# Patient Record
Sex: Male | Born: 1970 | Race: White | Hispanic: No | Marital: Single | Smoking: Current every day smoker
Health system: Southern US, Community
[De-identification: ages and names within clinical notes are randomized; demographics above are authoritative.]

## PROBLEM LIST (undated history)

## (undated) DIAGNOSIS — F329 Major depressive disorder, single episode, unspecified: Secondary | ICD-10-CM

## (undated) DIAGNOSIS — F419 Anxiety disorder, unspecified: Secondary | ICD-10-CM

## (undated) DIAGNOSIS — F32A Depression, unspecified: Secondary | ICD-10-CM

## (undated) DIAGNOSIS — F3130 Bipolar disorder, current episode depressed, mild or moderate severity, unspecified: Secondary | ICD-10-CM

## (undated) DIAGNOSIS — K7460 Unspecified cirrhosis of liver: Secondary | ICD-10-CM

## (undated) HISTORY — PX: HERNIA REPAIR: SHX51

---

## 2006-06-20 ENCOUNTER — Emergency Department: Payer: Self-pay | Admitting: Emergency Medicine

## 2006-06-21 ENCOUNTER — Other Ambulatory Visit: Payer: Self-pay

## 2006-06-22 ENCOUNTER — Inpatient Hospital Stay: Payer: Self-pay | Admitting: Internal Medicine

## 2006-07-07 ENCOUNTER — Emergency Department: Payer: Self-pay | Admitting: Emergency Medicine

## 2007-06-14 IMAGING — CR DG CHEST 1V
1 series · 1 of 1 positions shown · non-contrast
Comparison: none

REASON FOR EXAM: WANT A LATERAL  DECUBITUS FILM--- LEFT SIDE
COMMENTS:

PROCEDURE:     DXR - DXR CHEST 1 VIEWAP OR PA  - June 26, 2006  [DATE]
RESULT:          A single lateral decubitus film was obtained.  There
appears a small amount of pleural effusion on the LEFT.

[view not recorded]
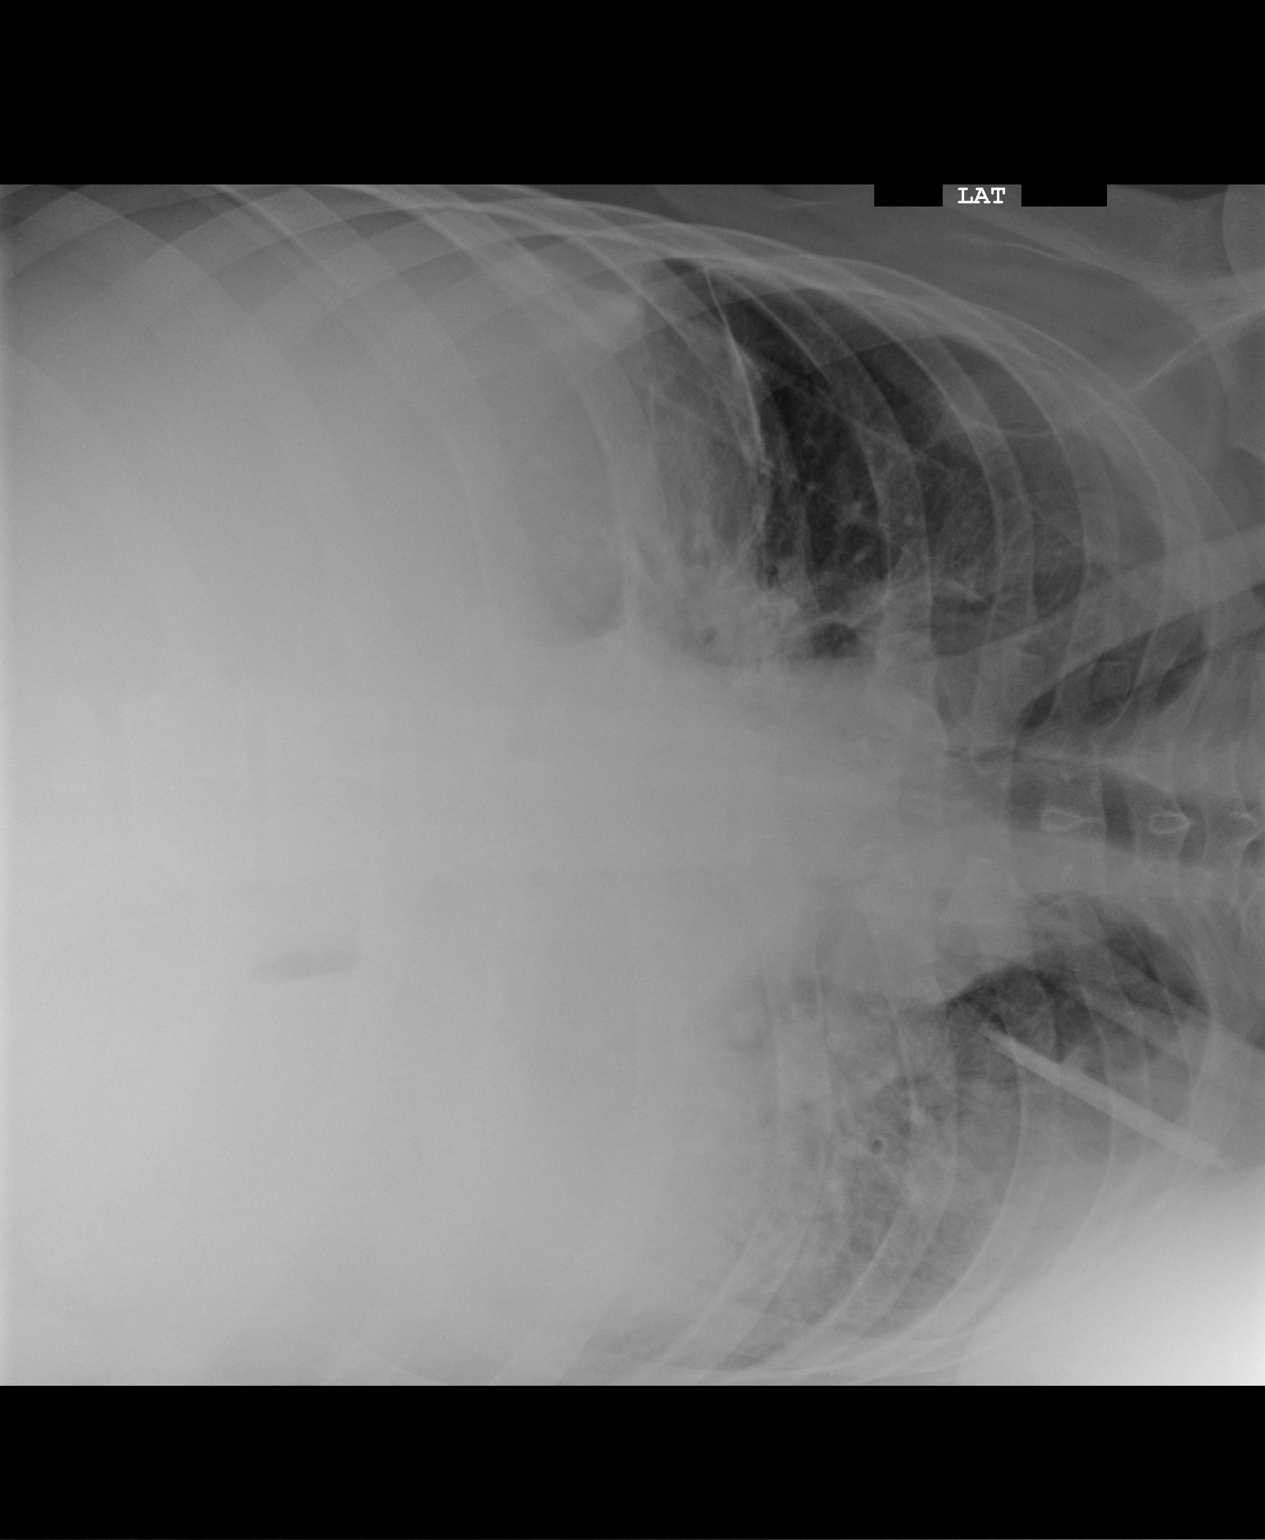

[1 of 1 positions shown; findings below may reference images not displayed]

IMPRESSION: Please see above.

## 2007-06-14 IMAGING — CR DG CHEST 1V PORT
1 series · 2 of 2 positions shown · non-contrast
Comparison: none

REASON FOR EXAM: Left pleural effusion as seen on chest CT
COMMENTS:

[Series 1: view not recorded · 0.17mm/px · 2 of 2 slices shown]
[im 1/2]
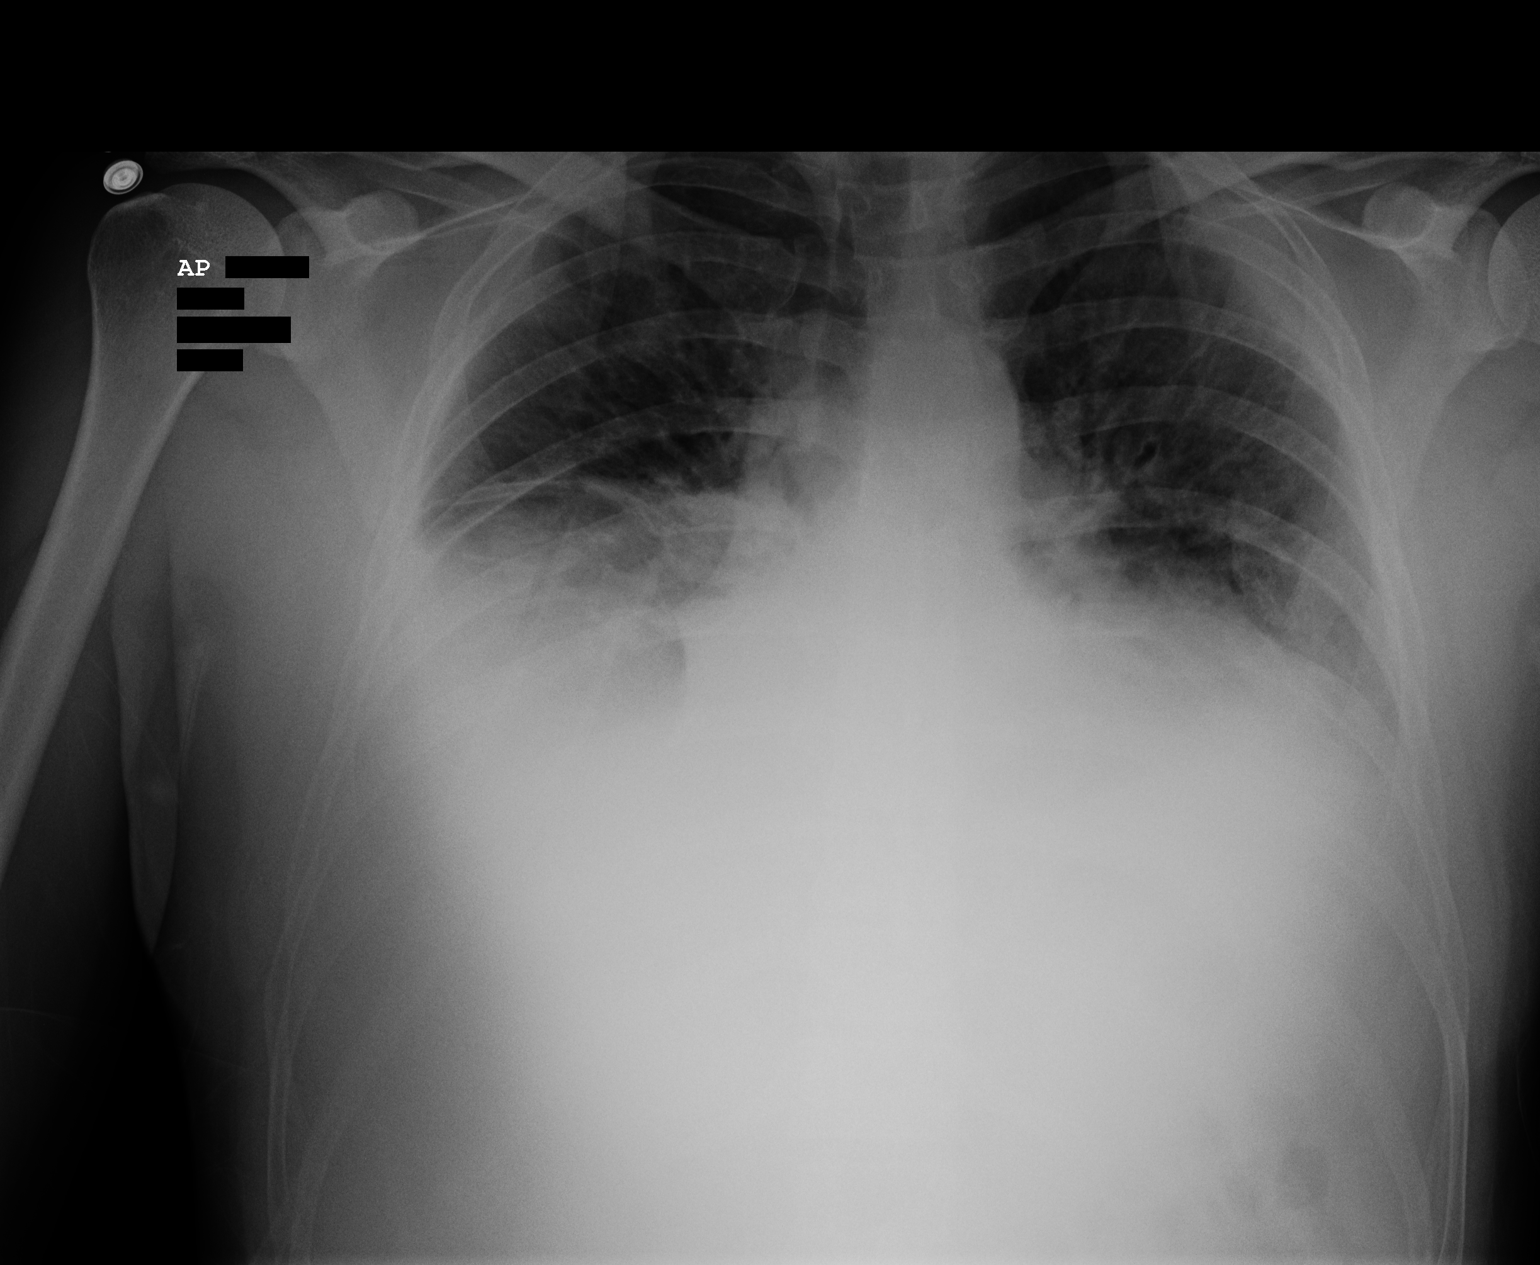
[im 2/2]
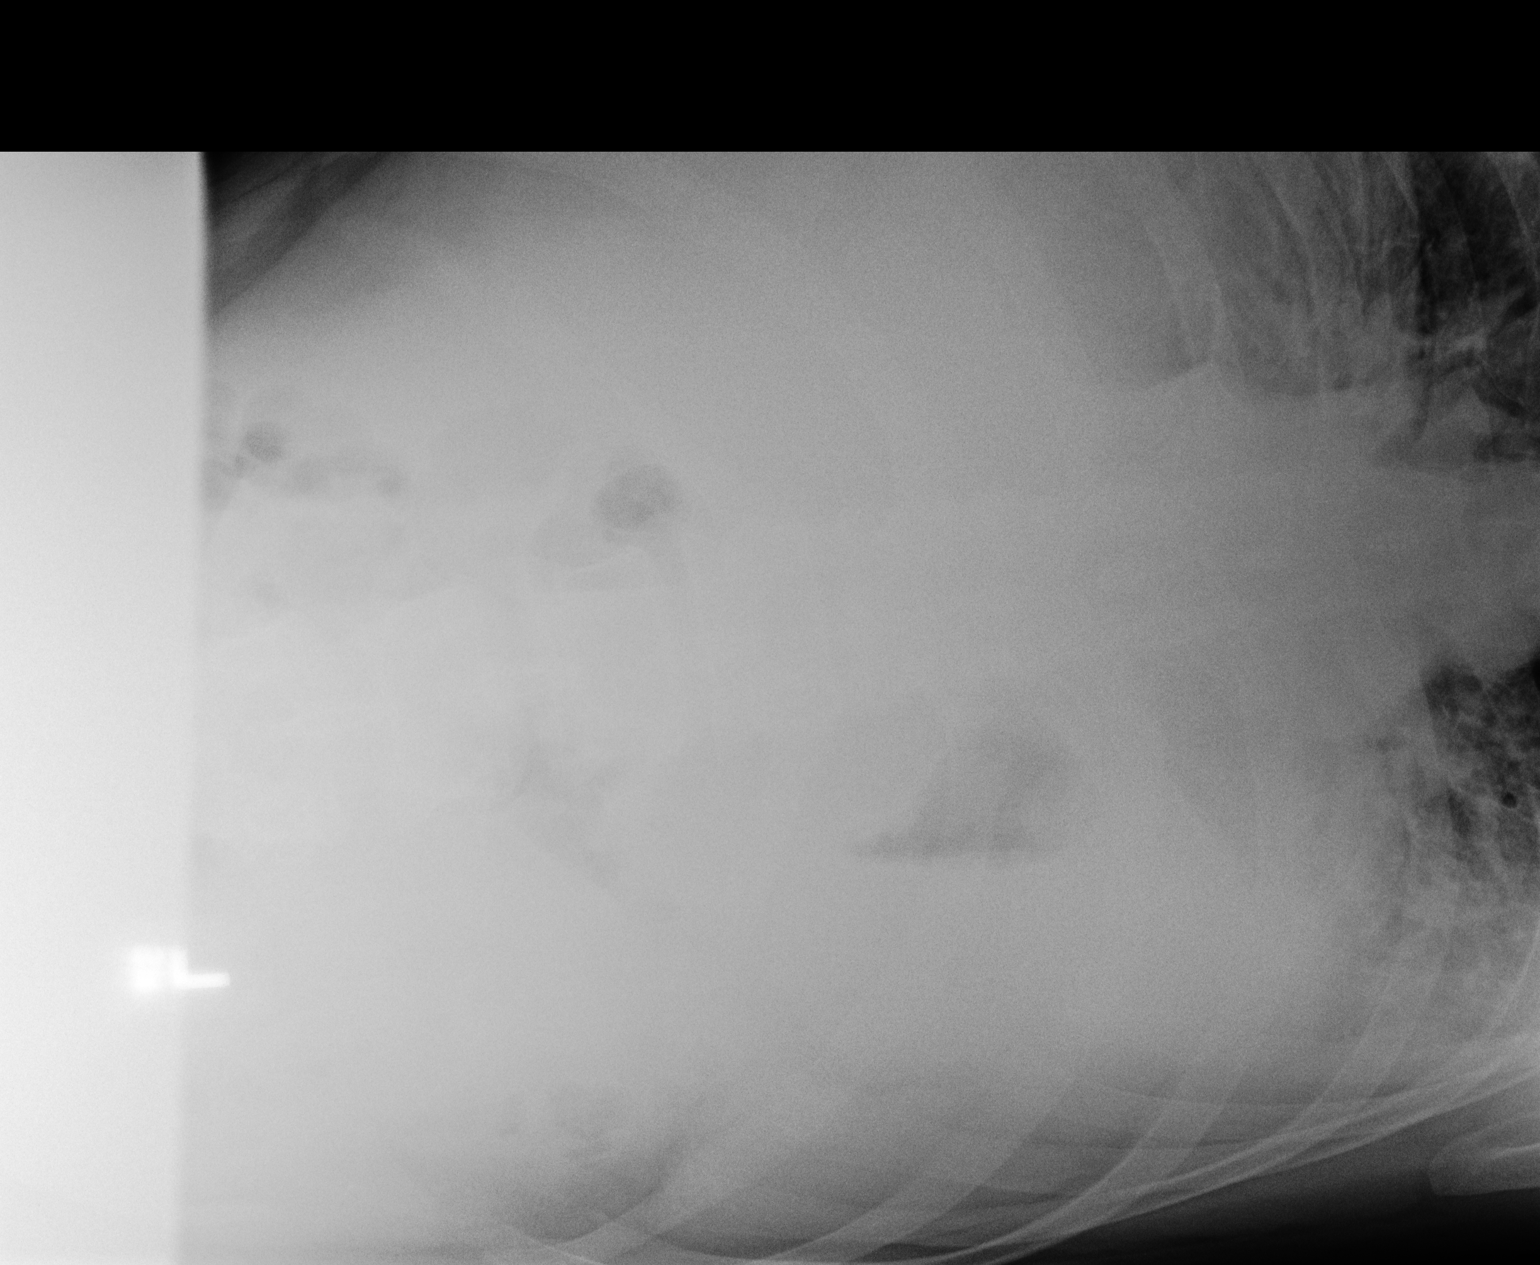

[2 of 2 positions shown; findings below may reference images not displayed]

PROCEDURE:     DXR - DXR PORTABLE CHEST SINGLE VIEW  - June 26, 2006  [DATE]

RESULT:     AP view of the chest is compared to a prior exam of 06/25/2006.
The current exam shows bibasilar infiltrates compatible with pneumonia or
atelectasis. The upper lobe infiltrates noted at prior CT are not definitely
seen on plain film examination. Following the portable chest radiograph, a
portable lateral decubitus view was obtained but the views of the abdomen
and only a small portion of the LEFT lung base is seen. There is increased
density at the LEFT lung base but the LEFT lung is not sufficiently
visualized to evaluate for layering of fluid. Repeat lateral decubitus views
specifically for the chest is; therefore, needed to evaluate for layering of
LEFT pleural effusion.
IMPRESSION: Please see above.

## 2009-05-20 ENCOUNTER — Emergency Department: Payer: Self-pay | Admitting: Emergency Medicine

## 2010-03-18 ENCOUNTER — Emergency Department: Payer: Self-pay | Admitting: Emergency Medicine

## 2010-12-27 ENCOUNTER — Emergency Department: Payer: Self-pay | Admitting: Emergency Medicine

## 2011-08-14 ENCOUNTER — Emergency Department: Payer: Self-pay | Admitting: Emergency Medicine

## 2012-10-10 ENCOUNTER — Emergency Department: Payer: Self-pay | Admitting: Emergency Medicine

## 2014-04-04 ENCOUNTER — Emergency Department: Payer: Self-pay | Admitting: Emergency Medicine

## 2014-04-04 LAB — ETHANOL: Ethanol: <3 mg/dL

## 2014-04-04 LAB — COMPREHENSIVE METABOLIC PANEL
ALK PHOS: 86 U/L
ALT: 126 U/L — AB (ref 12–78)
AST: 218 U/L — AB (ref 15–37)
Albumin: 4.5 g/dL (ref 3.4–5.0)
Anion Gap: 9 (ref 7–16)
BUN: 10 mg/dL (ref 7–18)
Bilirubin,Total: 4.9 mg/dL — ABNORMAL HIGH (ref 0.2–1.0)
CHLORIDE: 93 mmol/L — AB (ref 98–107)
CO2: 30 mmol/L (ref 21–32)
CREATININE: 0.77 mg/dL (ref 0.60–1.30)
Calcium, Total: 9.4 mg/dL (ref 8.5–10.1)
EGFR (Non-African Amer.): >60
GLUCOSE: 126 mg/dL — AB (ref 65–99)
OSMOLALITY: 265 (ref 275–301)
POTASSIUM: 2.8 mmol/L — AB (ref 3.5–5.1)
SODIUM: 132 mmol/L — AB (ref 136–145)
TOTAL PROTEIN: 9.1 g/dL — AB (ref 6.4–8.2)

## 2014-04-04 LAB — CBC WITH DIFFERENTIAL/PLATELET
BASOS ABS: 0 10*3/uL (ref 0.0–0.1)
BASOS PCT: 0.4 %
EOS ABS: 0 10*3/uL (ref 0.0–0.7)
EOS PCT: 0.1 %
HCT: 43.8 % (ref 40.0–52.0)
HGB: 14.9 g/dL (ref 13.0–18.0)
Lymphocyte #: 1.6 10*3/uL (ref 1.0–3.6)
Lymphocyte %: 17.6 %
MCH: 34 pg (ref 26.0–34.0)
MCHC: 33.9 g/dL (ref 32.0–36.0)
MCV: 100 fL (ref 80–100)
Monocyte #: 1.2 x10 3/mm — ABNORMAL HIGH (ref 0.2–1.0)
Monocyte %: 12.6 %
NEUTROS ABS: 6.5 10*3/uL (ref 1.4–6.5)
Neutrophil %: 69.3 %
Platelet: 104 10*3/uL — ABNORMAL LOW (ref 150–440)
RBC: 4.37 10*6/uL — AB (ref 4.40–5.90)
RDW: 13.8 % (ref 11.5–14.5)
WBC: 9.3 10*3/uL (ref 3.8–10.6)

## 2014-04-04 LAB — URINALYSIS, COMPLETE
Bacteria: NONE SEEN
GLUCOSE, UR: NEGATIVE mg/dL (ref 0–75)
Leukocyte Esterase: NEGATIVE
Nitrite: NEGATIVE
PH: 7 (ref 4.5–8.0)
SPECIFIC GRAVITY: 1.029 (ref 1.003–1.030)
Squamous Epithelial: 1
WBC UR: 1 /HPF (ref 0–5)

## 2014-04-04 LAB — TROPONIN I

## 2014-04-13 ENCOUNTER — Observation Stay: Payer: Self-pay | Admitting: Internal Medicine

## 2014-04-13 LAB — COMPREHENSIVE METABOLIC PANEL
ALBUMIN: 3.6 g/dL (ref 3.4–5.0)
ANION GAP: 10 (ref 7–16)
Alkaline Phosphatase: 76 U/L
BUN: 4 mg/dL — AB (ref 7–18)
Bilirubin,Total: 1.8 mg/dL — ABNORMAL HIGH (ref 0.2–1.0)
CALCIUM: 8.3 mg/dL — AB (ref 8.5–10.1)
CHLORIDE: 106 mmol/L (ref 98–107)
Co2: 25 mmol/L (ref 21–32)
Creatinine: 0.49 mg/dL — ABNORMAL LOW (ref 0.60–1.30)
EGFR (African American): >60
EGFR (Non-African Amer.): >60
Glucose: 93 mg/dL (ref 65–99)
Osmolality: 278 (ref 275–301)
Potassium: 3.4 mmol/L — ABNORMAL LOW (ref 3.5–5.1)
SGOT(AST): 227 U/L — ABNORMAL HIGH (ref 15–37)
SGPT (ALT): 127 U/L — ABNORMAL HIGH (ref 12–78)
SODIUM: 141 mmol/L (ref 136–145)
Total Protein: 7.7 g/dL (ref 6.4–8.2)

## 2014-04-13 LAB — PROTIME-INR
INR: 1.3
Prothrombin Time: 15.6 secs — ABNORMAL HIGH (ref 11.5–14.7)

## 2014-04-13 LAB — URINALYSIS, COMPLETE
BLOOD: NEGATIVE
Bacteria: NONE SEEN
Bilirubin,UR: NEGATIVE
Glucose,UR: NEGATIVE mg/dL (ref 0–75)
KETONE: NEGATIVE
LEUKOCYTE ESTERASE: NEGATIVE
NITRITE: NEGATIVE
PH: 7 (ref 4.5–8.0)
PROTEIN: NEGATIVE
Specific Gravity: 1.003 (ref 1.003–1.030)
Squamous Epithelial: NONE SEEN
WBC UR: NONE SEEN /HPF (ref 0–5)

## 2014-04-13 LAB — TROPONIN I
Troponin-I: <0.02 ng/mL
Troponin-I: <0.02 ng/mL

## 2014-04-13 LAB — DRUG SCREEN, URINE
AMPHETAMINES, UR SCREEN: NEGATIVE (ref ?–1000)
BARBITURATES, UR SCREEN: NEGATIVE (ref ?–200)
BENZODIAZEPINE, UR SCRN: NEGATIVE (ref ?–200)
COCAINE METABOLITE, UR ~~LOC~~: NEGATIVE (ref ?–300)
Cannabinoid 50 Ng, Ur ~~LOC~~: NEGATIVE (ref ?–50)
MDMA (ECSTASY) UR SCREEN: NEGATIVE (ref ?–500)
Methadone, Ur Screen: NEGATIVE (ref ?–300)
OPIATE, UR SCREEN: NEGATIVE (ref ?–300)
Phencyclidine (PCP) Ur S: NEGATIVE (ref ?–25)
Tricyclic, Ur Screen: NEGATIVE (ref ?–1000)

## 2014-04-13 LAB — CK-MB
CK-MB: 2.7 ng/mL (ref 0.5–3.6)
CK-MB: 2.5 ng/mL (ref 0.5–3.6)
CK-MB: 2.2 ng/mL (ref 0.5–3.6)

## 2014-04-13 LAB — APTT: Activated PTT: 36 secs — ABNORMAL HIGH (ref 23.6–35.9)

## 2014-04-13 LAB — AMMONIA: Ammonia, Plasma: 59 mcmol/L — ABNORMAL HIGH (ref 11–32)

## 2014-04-13 LAB — CK
CK, TOTAL: 275 U/L
CK, TOTAL: 242 U/L

## 2014-04-13 LAB — ETHANOL
ETHANOL %: 0.301 % — AB (ref 0.000–0.080)
ETHANOL LVL: 301 mg/dL — AB

## 2014-04-14 LAB — BASIC METABOLIC PANEL
Anion Gap: 8 (ref 7–16)
BUN: 5 mg/dL — ABNORMAL LOW (ref 7–18)
Calcium, Total: 8.8 mg/dL (ref 8.5–10.1)
Chloride: 104 mmol/L (ref 98–107)
Co2: 25 mmol/L (ref 21–32)
Creatinine: 0.71 mg/dL (ref 0.60–1.30)
EGFR (African American): >60
GLUCOSE: 104 mg/dL — AB (ref 65–99)
OSMOLALITY: 271 (ref 275–301)
Potassium: 3.8 mmol/L (ref 3.5–5.1)
Sodium: 137 mmol/L (ref 136–145)

## 2014-04-14 LAB — CBC WITH DIFFERENTIAL/PLATELET
BASOS ABS: 0.1 10*3/uL (ref 0.0–0.1)
BASOS PCT: 1.3 %
Eosinophil #: 0.1 10*3/uL (ref 0.0–0.7)
Eosinophil %: 2 %
HCT: 40.8 % (ref 40.0–52.0)
HGB: 13.8 g/dL (ref 13.0–18.0)
LYMPHS PCT: 26.6 %
Lymphocyte #: 1.3 10*3/uL (ref 1.0–3.6)
MCH: 34.4 pg — AB (ref 26.0–34.0)
MCHC: 33.7 g/dL (ref 32.0–36.0)
MCV: 102 fL — ABNORMAL HIGH (ref 80–100)
MONOS PCT: 17.9 %
Monocyte #: 0.9 x10 3/mm (ref 0.2–1.0)
NEUTROS PCT: 52.2 %
Neutrophil #: 2.6 10*3/uL (ref 1.4–6.5)
PLATELETS: 121 10*3/uL — AB (ref 150–440)
RBC: 4 10*6/uL — AB (ref 4.40–5.90)
RDW: 13.5 % (ref 11.5–14.5)
WBC: 4.9 10*3/uL (ref 3.8–10.6)

## 2014-05-23 ENCOUNTER — Emergency Department: Payer: Self-pay | Admitting: Emergency Medicine

## 2014-05-23 LAB — URINALYSIS, COMPLETE
Bacteria: NONE SEEN
Bilirubin,UR: NEGATIVE
Glucose,UR: NEGATIVE mg/dL (ref 0–75)
Ketone: NEGATIVE
LEUKOCYTE ESTERASE: NEGATIVE
Nitrite: NEGATIVE
Ph: 6 (ref 4.5–8.0)
Protein: NEGATIVE
RBC,UR: <1 /HPF (ref 0–5)
SQUAMOUS EPITHELIAL: NONE SEEN
Specific Gravity: 1.009 (ref 1.003–1.030)
WBC UR: <1 /HPF (ref 0–5)

## 2014-05-23 LAB — COMPREHENSIVE METABOLIC PANEL
ALBUMIN: 3.9 g/dL (ref 3.4–5.0)
ALK PHOS: 98 U/L
ALT: 122 U/L — AB
Anion Gap: 11 (ref 7–16)
BILIRUBIN TOTAL: 1.8 mg/dL — AB (ref 0.2–1.0)
BUN: 4 mg/dL — ABNORMAL LOW (ref 7–18)
CALCIUM: 8.5 mg/dL (ref 8.5–10.1)
CHLORIDE: 101 mmol/L (ref 98–107)
CREATININE: 0.61 mg/dL (ref 0.60–1.30)
Co2: 25 mmol/L (ref 21–32)
EGFR (African American): >60
GLUCOSE: 155 mg/dL — AB (ref 65–99)
OSMOLALITY: 274 (ref 275–301)
Potassium: 3.5 mmol/L (ref 3.5–5.1)
SGOT(AST): 283 U/L — ABNORMAL HIGH (ref 15–37)
Sodium: 137 mmol/L (ref 136–145)
TOTAL PROTEIN: 8.3 g/dL — AB (ref 6.4–8.2)

## 2014-05-23 LAB — DRUG SCREEN, URINE
Amphetamines, Ur Screen: NEGATIVE (ref ?–1000)
Barbiturates, Ur Screen: NEGATIVE (ref ?–200)
Benzodiazepine, Ur Scrn: NEGATIVE (ref ?–200)
CANNABINOID 50 NG, UR ~~LOC~~: POSITIVE (ref ?–50)
Cocaine Metabolite,Ur ~~LOC~~: NEGATIVE (ref ?–300)
MDMA (ECSTASY) UR SCREEN: NEGATIVE (ref ?–500)
Methadone, Ur Screen: POSITIVE (ref ?–300)
Opiate, Ur Screen: NEGATIVE (ref ?–300)
Phencyclidine (PCP) Ur S: NEGATIVE (ref ?–25)
Tricyclic, Ur Screen: NEGATIVE (ref ?–1000)

## 2014-05-23 LAB — CBC
HCT: 40.2 % (ref 40.0–52.0)
HGB: 13.7 g/dL (ref 13.0–18.0)
MCH: 34.9 pg — AB (ref 26.0–34.0)
MCHC: 34.1 g/dL (ref 32.0–36.0)
MCV: 102 fL — AB (ref 80–100)
Platelet: 118 10*3/uL — ABNORMAL LOW (ref 150–440)
RBC: 3.92 10*6/uL — AB (ref 4.40–5.90)
RDW: 14.3 % (ref 11.5–14.5)
WBC: 7.4 10*3/uL (ref 3.8–10.6)

## 2014-05-23 LAB — ACETAMINOPHEN LEVEL

## 2014-05-23 LAB — ETHANOL
Ethanol %: 0.364 % (ref 0.000–0.080)
Ethanol: 364 mg/dL

## 2014-05-23 LAB — SALICYLATE LEVEL: Salicylates, Serum: <1.7 mg/dL

## 2014-05-24 ENCOUNTER — Inpatient Hospital Stay: Payer: Self-pay | Admitting: Psychiatry

## 2014-05-24 LAB — ETHANOL
ETHANOL LVL: 278 mg/dL
Ethanol %: 0.102 % — ABNORMAL HIGH (ref 0.000–0.080)
Ethanol %: 0.166 % — ABNORMAL HIGH (ref 0.000–0.080)
Ethanol %: 0.278 % — ABNORMAL HIGH (ref 0.000–0.080)
Ethanol: 166 mg/dL
Ethanol: 102 mg/dL

## 2014-05-27 LAB — AMMONIA: AMMONIA, PLASMA: 51 umol/L — AB (ref 11–32)

## 2014-05-28 LAB — HEPATIC FUNCTION PANEL A (ARMC)
ALBUMIN: 3.3 g/dL — AB (ref 3.4–5.0)
Alkaline Phosphatase: 87 U/L
BILIRUBIN TOTAL: 2.4 mg/dL — AB (ref 0.2–1.0)
Bilirubin, Direct: 1 mg/dL — ABNORMAL HIGH (ref 0.00–0.20)
SGOT(AST): 99 U/L — ABNORMAL HIGH (ref 15–37)
SGPT (ALT): 72 U/L — ABNORMAL HIGH
Total Protein: 7.3 g/dL (ref 6.4–8.2)

## 2014-06-19 ENCOUNTER — Emergency Department: Payer: Self-pay | Admitting: Emergency Medicine

## 2014-06-25 ENCOUNTER — Inpatient Hospital Stay: Payer: Self-pay | Admitting: Psychiatry

## 2014-06-25 LAB — CBC
HCT: 41.5 % (ref 40.0–52.0)
HGB: 13.9 g/dL (ref 13.0–18.0)
MCH: 33.9 pg (ref 26.0–34.0)
MCHC: 33.5 g/dL (ref 32.0–36.0)
MCV: 101 fL — AB (ref 80–100)
PLATELETS: 157 10*3/uL (ref 150–440)
RBC: 4.1 10*6/uL — ABNORMAL LOW (ref 4.40–5.90)
RDW: 13.9 % (ref 11.5–14.5)
WBC: 9.8 10*3/uL (ref 3.8–10.6)

## 2014-06-25 LAB — URINALYSIS, COMPLETE
BLOOD: NEGATIVE
Bacteria: NONE SEEN
Bilirubin,UR: NEGATIVE
Glucose,UR: NEGATIVE mg/dL (ref 0–75)
Ketone: NEGATIVE
Leukocyte Esterase: NEGATIVE
Nitrite: NEGATIVE
PROTEIN: NEGATIVE
Ph: 6 (ref 4.5–8.0)
RBC,UR: 1 /HPF (ref 0–5)
Specific Gravity: 1.006 (ref 1.003–1.030)
Squamous Epithelial: NONE SEEN

## 2014-06-25 LAB — DRUG SCREEN, URINE
AMPHETAMINES, UR SCREEN: NEGATIVE (ref ?–1000)
BENZODIAZEPINE, UR SCRN: POSITIVE (ref ?–200)
Barbiturates, Ur Screen: NEGATIVE (ref ?–200)
Cannabinoid 50 Ng, Ur ~~LOC~~: POSITIVE (ref ?–50)
Cocaine Metabolite,Ur ~~LOC~~: NEGATIVE (ref ?–300)
MDMA (Ecstasy)Ur Screen: NEGATIVE (ref ?–500)
METHADONE, UR SCREEN: NEGATIVE (ref ?–300)
Opiate, Ur Screen: NEGATIVE (ref ?–300)
Phencyclidine (PCP) Ur S: NEGATIVE (ref ?–25)
Tricyclic, Ur Screen: NEGATIVE (ref ?–1000)

## 2014-06-25 LAB — COMPREHENSIVE METABOLIC PANEL
ALBUMIN: 3.6 g/dL (ref 3.4–5.0)
ALK PHOS: 87 U/L
AST: 219 U/L — AB (ref 15–37)
Anion Gap: 8 (ref 7–16)
BILIRUBIN TOTAL: 0.9 mg/dL (ref 0.2–1.0)
BUN: 5 mg/dL — ABNORMAL LOW (ref 7–18)
CHLORIDE: 107 mmol/L (ref 98–107)
CREATININE: 0.84 mg/dL (ref 0.60–1.30)
Calcium, Total: 8.8 mg/dL (ref 8.5–10.1)
Co2: 25 mmol/L (ref 21–32)
EGFR (African American): >60
EGFR (Non-African Amer.): >60
Glucose: 103 mg/dL — ABNORMAL HIGH (ref 65–99)
Osmolality: 277 (ref 275–301)
POTASSIUM: 3.5 mmol/L (ref 3.5–5.1)
SGPT (ALT): 110 U/L — ABNORMAL HIGH
Sodium: 140 mmol/L (ref 136–145)
TOTAL PROTEIN: 7.8 g/dL (ref 6.4–8.2)

## 2014-06-25 LAB — ETHANOL
ETHANOL LVL: 162 mg/dL
Ethanol: 362 mg/dL

## 2014-06-25 LAB — ACETAMINOPHEN LEVEL: Acetaminophen: <2 ug/mL

## 2014-06-25 LAB — SALICYLATE LEVEL: Salicylates, Serum: <1.7 mg/dL

## 2014-06-26 LAB — AMMONIA: Ammonia, Plasma: 92 mcmol/L — ABNORMAL HIGH (ref 11–32)

## 2014-10-07 ENCOUNTER — Emergency Department: Payer: Self-pay | Admitting: Emergency Medicine

## 2014-10-07 LAB — CBC
HCT: 43 % (ref 40.0–52.0)
HGB: 14.3 g/dL (ref 13.0–18.0)
MCH: 32.3 pg (ref 26.0–34.0)
MCHC: 33.3 g/dL (ref 32.0–36.0)
MCV: 97 fL (ref 80–100)
PLATELETS: 203 10*3/uL (ref 150–440)
RBC: 4.44 10*6/uL (ref 4.40–5.90)
RDW: 14.5 % (ref 11.5–14.5)
WBC: 13.8 10*3/uL — AB (ref 3.8–10.6)

## 2014-10-08 LAB — COMPREHENSIVE METABOLIC PANEL
ALK PHOS: 90 U/L
ALT: 65 U/L — AB
ANION GAP: 6 — AB (ref 7–16)
Albumin: 3.5 g/dL (ref 3.4–5.0)
BUN: 8 mg/dL (ref 7–18)
Bilirubin,Total: 0.5 mg/dL (ref 0.2–1.0)
CO2: 29 mmol/L (ref 21–32)
Calcium, Total: 8.2 mg/dL — ABNORMAL LOW (ref 8.5–10.1)
Chloride: 108 mmol/L — ABNORMAL HIGH (ref 98–107)
Creatinine: 0.74 mg/dL (ref 0.60–1.30)
EGFR (African American): >60
Glucose: 80 mg/dL (ref 65–99)
OSMOLALITY: 282 (ref 275–301)
Potassium: 4 mmol/L (ref 3.5–5.1)
SGOT(AST): 81 U/L — ABNORMAL HIGH (ref 15–37)
Sodium: 143 mmol/L (ref 136–145)
Total Protein: 7.9 g/dL (ref 6.4–8.2)

## 2014-10-08 LAB — URINALYSIS, COMPLETE
Bacteria: NONE SEEN
Bilirubin,UR: NEGATIVE
Blood: NEGATIVE
Glucose,UR: NEGATIVE mg/dL (ref 0–75)
KETONE: NEGATIVE
LEUKOCYTE ESTERASE: NEGATIVE
Nitrite: NEGATIVE
Ph: 6 (ref 4.5–8.0)
Protein: NEGATIVE
RBC,UR: NONE SEEN /HPF (ref 0–5)
SQUAMOUS EPITHELIAL: NONE SEEN
Specific Gravity: 1.003 (ref 1.003–1.030)
WBC UR: <1 /HPF (ref 0–5)

## 2014-10-08 LAB — DRUG SCREEN, URINE

## 2014-10-08 LAB — ETHANOL: ETHANOL LVL: 356 mg/dL — AB

## 2014-10-08 LAB — ACETAMINOPHEN LEVEL: Acetaminophen: <2 ug/mL

## 2014-10-08 LAB — SALICYLATE LEVEL: Salicylates, Serum: <1.7 mg/dL

## 2015-02-10 NOTE — Consult Note (Signed)
UPDATE: After orders were done I have learned he has been accepted to [facility]. Due to the holiday we would be unlikely to make this happen until Tuesday anyway, so I still think it is for the best to admit him. Currently still a voluntary patient and will need some arrangements for transport to go to [facility] Tuesday.  Electronic Signatures: Audery Amellapacs, Aemilia Dedrick T (MD)  (Signed on 06-Sep-15 23:45)  Authored  Last Updated: 06-Sep-15 23:45 by Audery Amellapacs, Zaiyden Strozier T (MD)

## 2015-02-10 NOTE — Consult Note (Signed)
PATIENT NAME:  [name] BurrowREVATTE, Dallas J MR#:  [id number] DATE OF BIRTH:  Sep 17, 1971  DATE OF CONSULTATION:  10/08/2014  REFERRING PHYSICIAN:   CONSULTING PHYSICIAN:  Raudel Bazen K. Kaynen Minner, MD    SUBJECTIVE: The patient was [facility] [place]. The patient is a 44 year old white male who is not employed and divorced for many years and currently lives with his parents. The patient is currently enrolled at a program for his alcohol problems. The patient reports that last night he was drinking too much and to the point that he was getting drunk and his girlfriend was using cocaine and he was afraid that they get into arguments and problems related to the same. In order to prevent arguments and in order to prevent going to jail, he decided to come here for help and to be away from the situation.  MENTAL STATUS: The patient is alert and oriented to place, person and time.. Affect is neutral. Mood is stable. Denies feeling depressed. Denies feeling h/h. Does not appear to be responding to internal stimuli.. Cognition is intact and insight and judgement appear fair and adequate.. Impulse control is fair.  IMPRESSION: Alcohol dependence, chronic, continuous. Substance-induced mood disorder.  RECOMMENDATIONS: Discontinue involuntary commitment and discharge the patient back home as he contracts for safety and he is eager to go back to his  living situation and attend program for his substance abuse.    ____________________________ Jannet MantisSurya K. Guss Bundehalla, MD skc:sw D: 10/08/2014 13:42:00 ET T: 10/08/2014 18:09:18 ET JOB#: [id number]  cc: Monika SalkSurya K. Guss Bundehalla, MD, <Dictator> Beau FannySURYA K Lipa Knauff MD ELECTRONICALLY SIGNED 10/10/2014 9:01

## 2015-02-10 NOTE — Consult Note (Signed)
Brief Consult Note: Comments: Pt is not available for Psychiatric Evaluation as he went down for Cardiac Stress test. Will attempt again later.  Electronic Signatures: Rhunette CroftFaheem, Uzma S (MD)  (Signed 25-Jun-15 14:13)  Authored: Brief Consult Note   Last Updated: 25-Jun-15 14:13 by Rhunette CroftFaheem, Uzma S (MD)

## 2015-02-10 NOTE — Discharge Summary (Signed)
PATIENT NAME:  [name] [name], [name] J MR#:  [id number] DATE OF BIRTH:  [birth year]  DATE OF ADMISSION:  04/13/2014 DATE OF DISCHARGE:  04/14/2014  For a detailed note, please refer to the history and physical done on admission by Dr. Heron NayVasireddy.   DIAGNOSES AT DISCHARGE: As follows:  1. Chest pain, likely anxiety in nature.  2. Alcohol abuse. 3. Tobacco abuse.   DIET: The patient is being discharged on a regular diet.   ACTIVITY:  Is as tolerated.   FOLLOWUP:  Is at the [facility] in the next 1-2 weeks.   DISCHARGE MEDICATIONS: Thiamine 100 mg daily, folic acid 1 mg daily.   PERTINENT STUDIES DONE DURING THE HOSPITAL COURSE: Are as follows: A CT scan of the head done without contrast on admission showing no acute intracranial abnormalities. A chest x-ray done on admission showing no acute cardiopulmonary disease.  A nuclear medicine myocardial scan done on 06/25 showing a normal ejection fraction of 55%. No EKG changes concerning for ischemia. No significant wall motion abnormalities. A pharmacological  myocardial perfusion study without significant ischemia.   HOSPITAL COURSE: This is a 44 year old male with medical problems as mentioned above presented to the hospital due to chest pain, was also noted to be intoxicated.  1. Chest pain. The most likely cause of the patient's chest pain was anxiety-mediated. The patient likely had a panic attack which propagated his cardiac symptoms. He was observed on telemetry, had 3 sets of cardiac markers checked which were negative. He had no EKG changes concerning for cardiac ischemia. He also underwent a myocardial nuclear perfusion stress test which showed no acute abnormalities. The patient was chest pain free and hemodynamically stable, therefore discharged home.  2. Alcohol abuse. The patient has a history of heavy alcohol abuse. His alcohol level was significantly elevated when he presented to the hospital at 0.3. He was at high risk for alcohol  withdrawal therefore was placed on CIWA protocol. A psychiatric consult was obtained to see if the patient will benefit from inpatient psychiatric and detoxification, although he had no interest in quitting drinking at this time and refused any help at this time. As this patient refused any help for his alcohol abuse, he was therefore discharged home.  3. Tobacco abuse. While in the hospital, the patient was maintained on nicotine patch and was strongly advised to quit smoking.   Since patient is hemodynamically stable, chest pain free, he is being discharged home.   TIME SPENT ON DISCHARGE: 40 minutes.    ____________________________ Rolly PancakeVivek J. Cherlynn KaiserSainani, MD vjs:dd D: 04/18/2014 20:43:13 ET T: 04/19/2014 03:50:46 ET JOB#: [id number]  cc: Rolly PancakeVivek J. Cherlynn KaiserSainani, MD, <Dictator> [facility] [place] SirenVIVEK J SAINANI MD ELECTRONICALLY SIGNED 05/05/2014 20:32

## 2015-02-10 NOTE — H&P (Signed)
PATIENT NAME:  [name] [name], [name] [name] MR#:  [id number] DATE OF BIRTH:  May 27, 1971  DATE OF ADMISSION:  05/24/2014  EVALUATION DATE: 05/25/2014  REFERRING PHYSICIAN: Emergency Room MD  ATTENDING PHYSICIAN: Orson Slick, MD   IDENTIFYING DATA: [name] [name] is a 44 year old male with history of alcoholism.   CHIEF COMPLAINT: "I need to stop."   HISTORY OF PRESENT ILLNESS: [name] [name] has been drinking most of his life. [name] [name] lost 2 jobs because of drinking and has been unable to work. [name] [name] consumes over 30 beers a day. [name] [name] feels that his general health is getting worse as [name] [name] was hospitalized in June for chest pain that turned out to be not related to any cardiac condition. [name] [name] is sick and tired of drinking and wants to get better. [name] [name] denies symptoms of depression, but does report anxiety, especially when [name] [name] tries to stop drinking. On the day of admission actually [name] [name] went to [facility] asking for [facility] detox, but was too shaky. The staff at [facility] felt that the patient is in risk of hurting seizures and sent him to the Emergency Room for higher level of care. The patient reported some hallucinations in the Emergency Room, but this has resolved with alcohol detox protocol medications. [name] [name] denies symptoms suggestive of bipolar mania. [name] [name] denies other than alcohol substance use.   PAST PSYCHIATRIC HISTORY: [name] [name] has never been hospitalized in psychiatry, no suicide attempt, no treatment as an outpatient, and no substance abuse treatment. [name] [name] met with Lanae Boast, a [facility] representative, once and was referred to [facility] but never followed up.   FAMILY PSYCHIATRIC HISTORY: Multiple family members with alcoholism.   PAST MEDICAL HISTORY: Reports alcoholic liver disease.   ALLERGIES: No known drug allergies.   MEDICATIONS ON ADMISSION: None.   SOCIAL HISTORY: [name] [name] lives with his mother and his wife. [name] [name] is unemployed. [name] [name] has no health insurance.   REVIEW OF SYSTEMS:  CONSTITUTIONAL: No fevers or chills. Positive for fatigue.   EYES: No double or blurred vision.  ENT: No hearing loss.  RESPIRATORY: No shortness of breath or cough.  CARDIOVASCULAR: No chest pain or orthopnea.  GASTROINTESTINAL: No abdominal pain, nausea, vomiting, or diarrhea.  GENITOURINARY: No incontinence or frequency.  ENDOCRINE: No heat or cold intolerance.  LYMPHATIC: No anemia or easy bruising.  INTEGUMENTARY: No acne or rash.  MUSCULOSKELETAL: No muscle or joint pain.  NEUROLOGIC: No tingling or weakness.  PSYCHIATRIC: See history of present illness for details.   PHYSICAL EXAMINATION:  VITAL SIGNS: Blood pressure 136/90, pulse 92, respirations 20, temperature 98.8.  GENERAL: This is a well-developed male in no acute distress.  HEENT: The pupils are equal, round and reactive to light. Sclerae anicteric.  NECK: Supple. No thyromegaly.  LUNGS: Clear to auscultation. No dullness to percussion.  HEART: Regular rhythm and rate. No murmurs, rubs or gallops.  ABDOMEN: Soft, nontender, nondistended. Positive bowel sounds.  MUSCULOSKELETAL: Normal muscle strength in all extremities.  SKIN: No rashes or bruises.  LYMPHATIC: No cervical adenopathy.  NEUROLOGIC: Cranial nerves II through XII are intact.   LABORATORY DATA: Chemistries are within normal limits, except for blood glucose of 155 and blood alcohol level on admission of 0.364. LFTs: Total bilirubin 1.8, alkaline phosphatase 98, AST 283, ALT 122. Urine tox screen is positive for cannabinoid and methadone. CBC within normal limits, except for platelets of 118. Urinalysis is not suggestive of urinary tract infection. Serum acetaminophen and salicylate are low.   MENTAL STATUS EXAMINATION ON ADMISSION: The patient is alert and oriented to  person, place, time and situation. [name] [name] is in bed with poor grooming and hygiene. [name] [name] maintains limited eye contact. [name] [name] appears tired. There is psychomotor retardation. [name] [name] is pleasant, polite and cooperative. His speech is slow and soft. Mood is depressed with  flat affect. Thought process is logical. [name] [name] denies thoughts of hurting himself or others. There are no delusions or paranoia. [name] [name] denies auditory or visual hallucinations. His cognition is grossly intact. Registration, recall, short and long-term memory seem okay. [name] [name] is of average intelligence and fund of knowledge. His insight and judgment are poor.   SUICIDE RISK ASSESSMENT ON ADMISSION: This is a patient with long history of alcoholism who came to the hospital for alcohol detox. There are no safety issues.  INITIAL DIAGNOSES:  AXIS I:  1.  Alcohol dependence. 2.  Cannabis dependence. 3.  Opiate dependence or abuse.  AXIS II: Deferred.  AXIS III: Alcoholic liver disease.  AXIS IV: Substance abuse, marital conflict, occupational, financial, access to care, poor insight.  AXIS V: Global assessment of functioning 35.   PLAN: The patient was admitted to [facility] behavioral medicine unit for safety, stabilization and medication management. [name] [name] was initially placed on suicide precautions and was closely monitored for any unsafe behavior. [name] [name] underwent full psychiatric and risk assessment. [name] [name] received pharmacotherapy, individual and group psychotherapy, substance abuse counseling, and support from therapeutic milieu.  1.  Alcohol detox: [name] [name] is on the CIWA protocol with addition of Librium. [name] [name] required 4 doses in the past 17 hours of Ativan. Will monitor for symptoms of withdrawal. 2.  Substance abuse treatment: The patient is not interested in residential treatment at this point. [name] [name] will likely follow up with [facility] IOP program.  3.  Alcoholic liver disease: Will monitor liver function tests.  4.  Disposition: [name] [name] will be discharged to home.  ____________________________ Wardell Honour. Bary Leriche, MD jbp:sb D: 05/25/2014 11:13:52 ET T: 05/25/2014 11:40:28 ET JOB#: [id number]  cc: Raquell Richer B. Bary Leriche, MD, <Dictator> Clovis Fredrickson MD ELECTRONICALLY SIGNED 05/25/2014 23:34

## 2015-02-10 NOTE — Consult Note (Signed)
PATIENT NAME:  [name] [name], [name] [name] MR#:  [id number] DATE OF BIRTH:  02/25/1971  DATE OF CONSULTATION:  05/24/2014  REFERRING PHYSICIAN:   CONSULTING PHYSICIAN:  Audery Amel, MD  IDENTIFYING INFORMATION AND REASON FOR CONSULTATION: A 44 year old man who came into the Emergency Room yesterday voluntarily asking for detox. Consultation for appropriate treatment and disposition.   HISTORY OF PRESENT ILLNESS: Information obtained from the patient and the chart. The patient presented himself to the hospital yesterday with a blood alcohol level over 300 requesting detox. He was referred to [facility]. Apparently, he went to [facility] today and was turned away, told that he was too sick and that they were afraid he was going to have a seizure there. He was returned to the Emergency Room. The patient is still very shaky and needs alcohol withdrawal. Reports that he routinely drinks about 30 beers a day. Last drink now was probably a little over 24 hours ago. He denies that he is abusing any other drugs. Mood is anxious, but denies any suicidal ideation. Currently,  he is having a few visual hallucinations, but has no delusions about it. He has been compliant with treatment here in the Emergency Room.   PAST PSYCHIATRIC HISTORY: [name] [name] has a long history of alcohol dependence. I saw him earlier in the summer on June 26 when he was on the medical floor. At that time, he was also having alcohol withdrawal, and I had offered him the option of admission to psychiatry, which he declined. He has not been in any kind of rehab or long-term treatment in the past and has never been able to stay sober for any significant length of time.   FAMILY HISTORY: Positive for alcohol use.   SOCIAL HISTORY: The patient lives with his wife and his mother. Currently, he has not been able to work because of his heavy drinking.   PAST MEDICAL HISTORY: He was in the hospital in June for an episode of chest pain and trouble breathing which  turned out to have no specific cause. Does not have a history of coronary artery disease. He has had pneumonia in the past, however.   MEDICATIONS: Not on any standard medication.   ALLERGIES: No known drug allergies.   REVIEW OF SYSTEMS:  Feeling very shaky, somewhat sick to his stomach, having some visual hallucinations of spots. Denies suicidal or homicidal ideation. Has a little bit of aching pain all over. No other specific physical symptoms.   MENTAL STATUS EXAMINATION: Disheveled man, looks his stated age, cooperative with the interview. Eye contact good. Psychomotor activity marked by a tremor all over and otherwise really unable to even stand up or move in a coordinated way. Speech is decreased in total amount and quiet. Affect is flat and anxious. Mood stated as being bad. Thoughts are lucid but very slow. No evidence of delusions. He does have visual hallucinations. No auditory hallucinations. No suicidal or homicidal ideation. He is alert and oriented x 4. Short and long-term memory intact to gross testing, 1/3 objects at 3 minutes.   PHYSICAL EXAMINATION:  GENERAL: As mentioned, he has a diffuse tremor all over, barely able to move his arms in a coordinated way right now.  VITAL SIGNS: Blood pressure last read as 142/86, respirations 20, pulse 111, respirations 98.3.   LABORATORY RESULTS: Since coming back into the ER, his alcohol level was rechecked at 102. It was 166 earlier in the morning, but when he first came in yesterday it was 364. Drug  screen positive for cannabis. Chemistry is elevated, bilirubin 1.8, elevated glucose 155, elevated AST 263. Hematology panel: Low platelet count 118, low red blood cell count 392, elevated MCV 102.   ASSESSMENT: A 44 year old man who has alcohol dependence, who is going through alcohol withdrawal. He has a past history positive for delirium tremens. He is quite sick right now, tachycardic, tremulous, having visual hallucinations. He is in the time  period where he is still at acute risk of seizures and risk of delirium tremens. He was turned away by [facility]. It is appropriate to admit him to psychiatry at this point for completion of detox and substance abuse treatment.   TREATMENT PLAN: I gave him 50 mg of Librium stat right now and then 50 mg q. 6 hours for the next day, meanwhile putting him on the regular CIWA detox protocol. Nicotine patch done. Educated him about the process. He is going to finish a bag of fluid here in the ER and then he can be admitted downstairs.   DIAGNOSIS, PRINCIPAL AND PRIMARY:  AXIS I: Alcohol dependence.   SECONDARY DIAGNOSES: AXIS I: Delirium due to alcohol withdrawal, resolving.  AXIS II: No diagnosis.  AXIS III: Alcohol dependence.  AXIS IV: Severe from the degree to which his substance abuse is affecting his mental state and livelihood.  AXIS V: Functioning at time of evaluation is 30.    ____________________________ Audery AmelJohn T. Laura-Lee Villegas, MD jtc:at D: 05/24/2014 17:06:42 ET T: 05/24/2014 18:17:59 ET JOB#: [id number]  cc: Audery AmelJohn T. Desarie Feild, MD, <Dictator> Audery AmelJOHN T Izyk Marty MD ELECTRONICALLY SIGNED 06/30/2014 16:55

## 2015-02-10 NOTE — H&P (Signed)
PATIENT NAME:  [name] [name], [name] [name] MR#:  [id number] DATE OF BIRTH:  04-18-1971  DATE OF ADMISSION:  04/13/2014  PRIMARY CARE PHYSICIAN: None.   REFERRING PHYSICIAN: Dr. Dorothea GlassmanPaul Malinda.   CHIEF COMPLAINT: Chest pain.   HISTORY OF PRESENT ILLNESS:  [name] [name] is a 44 year old male with a history of heavy alcohol use, cirrhosis, and hepatitis C, who presented to the Emergency Department complaining of chest pain starting in the middle of the night. The patient woke up with the chest pain.  When he stood up, the patient fell down to the floor to the left side. He started to experience numbness on the whole left side of the body. Considering this, called the EMS and was brought to the Emergency Department.  The patient states he still has the chest pain with palpations. Work-up in the Emergency Department with EKG and cardiac enzymes were unremarkable. The patient, on a daily basis, drinks more than 24 cans of beer, continues to smoke 1 pack a day. The patient's father died in his 7150s from liver cancer and had 2 heart attacks.  The patient states the pain is sharp in nature.  Radiating to the left arm. Denies having any shortness of breath, nausea, or vomiting.  Experiences some lightheadedness.   PAST MEDICAL HISTORY:  1.  Cirrhosis.  2.  Hepatitis C.  3.  Previous lung problems.   PAST SURGICAL HISTORY: Hernia repair.   ALLERGIES: NO KNOWN DRUG ALLERGIES.   HOME MEDICATIONS: None.   SOCIAL HISTORY: Continues to smoke 1 pack a day. Drinks more than 24 cans of beer.  Married lives with his wife.   FAMILY HISTORY: Father died of liver cancer and had 2 heart attacks.   REVIEW OF SYSTEMS:  CONSTITUTIONAL: Generalized weakness.  EYES: No change in vision.  EARS, NOSE, AND THROAT: No change in hearing. RESPIRATORY: No cough or shortness of breath. CARDIOVASCULAR: Has chest pain.  GASTROINTESTINAL: No nausea, vomiting, or abdominal pain.  GENITOURINARY: No dysuria or hematuria.  HEMATOLOGY:  No  easy bruising or bleeding.  SKIN: No rash or lesions.  MUSCULOSKELETAL: Has pain on the right side of the body.  SKIN: No rashes or lesions.  NEUROLOGIC: No weakness or numbness in any part of the body.   PHYSICAL EXAMINATION:  GENERAL: This a well-built, well-nourished, age-appropriate male lying down in the bed, not in distress.  VITAL SIGNS: Temperature 98.2, pulse 78, blood pressure 112/77, respiratory rate of 14, oxygen saturation is 98% on room air.  HEENT: Head normocephalic, atraumatic. Eyes, no scleral icterus. Conjunctivae normal. Pupils equal and react to light. Extraocular movements are intact. Mucous membranes moist. No pharyngeal erythema.  NECK: Supple. No lymphadenopathy or JVD. No carotid bruit. No thyromegaly.  CHEST: Has no focal tenderness.  LUNGS: Clear to auscultation.  HEART: S1, S2 regular. No murmurs are heard.  ABDOMEN: Bowel sounds present. Soft, nontender, nondistended. No hepatosplenomegaly.  EXTREMITIES: No pedal edema. Pulses 2+.  SKIN: No rash or lesions.  MUSCULOSKELETAL: Good range of motion in all the extremities. NEUROLOGIC:  The patient is alert and oriented to place, person, and time. Cranial nerves II-XII intact. Motor 5/5 in upper and lower extremities.   LABORATORIES: Troponin less than 0.02. CMP: Total bilirubin of 1.8, ALT 127, AST of 227. Urinalysis negative for nitrites and leukocyte esterase.   EKG, 12-lead: Normal sinus rhythm with no ST-T wave abnormalities.   ASSESSMENT AND PLAN: Mr. Tresa [name] is a 44 year old male who comes to the Emergency Department with complaints of chest  pain.   1.  Chest pain. It seems to more effect musculoskeletal.  Patient states he woke up with the chest pain. We will admit the patient under observation via monitored bed and cycle cardiac enzymes x 3. If negative, will get a stress test considering the patient's family history and history of alcohol and tobacco use.   2.  Hypokalemia: Replaced by mouth.   3.   Heavy alcohol use. Keep the patient on thiamine and folic acid.   4.  Keep the patient on deep vein thrombosis prophylaxis with Lovenox.   TIME SPENT: 55 minutes.    ____________________________ Susa Griffins, MD pv:ts D: 04/13/2014 03:35:10 ET T: 04/13/2014 09:45:12 ET JOB#: [id number]  cc: Susa Griffins, MD, <Dictator> Susa Griffins MD ELECTRONICALLY SIGNED 04/13/2014 21:03

## 2015-02-10 NOTE — Consult Note (Signed)
PATIENT NAME:  [name] [name], [name] [name] MR#:  [id number] DATE OF BIRTH:  [birth year]  DATE OF CONSULTATION:  04/14/2014  REFERRING PHYSICIAN:   CONSULTING PHYSICIAN:  Audery Amel, MD  IDENTIFYING INFORMATION AND REASON FOR CONSULTATION: A 44 year old gentleman currently in the hospital for an episode of being unable to breathe. Consultation for alcohol dependence, detox.   HISTORY OF PRESENT ILLNESS: Information obtained from the patient and from the chart. The patient says that he came to the hospital because he had an episode in which he could not breathe. He says those have been happening more frequently, sometimes several times a day. Usually they are transient, but this one in particular apparently was so bad that he actually fell the ground and was turning blue. After coming into the hospital, no identified new pulmonary problem could be seen, but he certainly was intoxicated and in need of alcohol withdrawal. The patient tells me that he has been drinking about a case of beer a day. He had managed to cut down at some point but recently resumed his heavy drinking. He said that he had been intending to cut back because he was thinking he would go back to work soon. He admits that he smokes pot, but denies using any other drugs. The patient denies depression, denies suicidal ideation, denies psychotic symptoms. He is not aware to describe these episodes of not breathing as panic attacks, although that would be in the differential diagnosis.   PAST PSYCHIATRIC HISTORY: The patient denies any past psychiatric treatment. Denies that he has been in a psychiatric hospital or on any prescription psychiatric medicine. Says that he has never been to any kind of substance abuse rehab program either.   FAMILY HISTORY: Diffusely positive for substance abuse and behavior problems.   PAST MEDICAL HISTORY: Acutely has these episodes of not breathing, which could be panic related. Has a past history of pneumonia.  Says that he does not know of any other ongoing medical problems.   SOCIAL HISTORY: He lives with his domestic partner, I did not clarify if she was his wife or not, a child of hers and 1 member of an older generation. 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 He claims that he does not drink at work, but he does have a DUI.   SUBSTANCE ABUSE HISTORY: Specifically, he denies knowing of any delirium tremens or seizures from stopping drinking.   REVIEW OF SYSTEMS: Still feeling tired, not feeling like getting out of bed. A little bit sick but not throwing up. Does not have any hallucinations today. Still feeling a little bit shaky.   MENTAL STATUS EXAMINATION: Disheveled, malodorous sick-appearing gentleman who is nevertheless cooperative with the interview. Eye contact was good. Psychomotor activity very limited and slow. Speech totally decreased in amount and slowed down. Affect blunted. Mood stated as being sick. Thoughts are slow but lucid. No evidence of delusions or loosening of associations. Denies auditory or visual hallucinations. Denies suicidal or homicidal ideation. Judgment and insight currently are okay. He understands that drinking is having a severe effect on his health.   VITAL SIGNS: Currently blood pressure 154/82, pulse 99, temperature 97.6.   MEDICATIONS: Currently he is getting lactulose, magnesium oxide, a multivitamin, nitroglycerin ointment, thiamine, Ativan on a fairly regular basis right now and has even recently been getting some morphine injections today.   ALLERGIES: No known drug allergies.   ASSESSMENT: This is a 44 year old gentleman who presented with a blood alcohol level  over 300. His vital signs are continuing to show signs of alcohol withdrawal. His chemistry panel on admission showed a low potassium, low calcium, elevated bilirubin and elevated liver enzymes all typical of acute alcohol abuse. He has an ammonia level that is elevated today it 59.  His CBC shows a platelet count low at 121, red blood cell count low at 4, MCV elevated at 102, all signs of alcohol abuse as well. In short, this is a gentleman who clearly has an acute alcohol problem. He is still having withdrawal symptoms. He is still in the window where he might be at risk of delirium tremens if he were not treated with medication for alcohol withdrawal. So that treatment and if allowed to be on his own the chances are extremely high that he will return to drinking as he admits. The patient was counseled about this and given the recommendation that he stay in the hospital at least until he was physically more stable before being discharged. He agreed to this. I told him that we could do that on the psychiatry ward, and I would do that if he were agreeable to it. He did not agree to that. The patient seems able to make a lucid decision about this and is declining transfer to the psychiatry ward.   TREATMENT PLAN: The patient is not under involuntary commitment and does not meet commitment criteria. My recommendation is that he continue on the hospital service with medical stabilization of his alcohol withdrawal. We would be happy to consult if needed over the weekend. He is not interested in going to rehab and can be referred to go to Alcoholic's Anonymous meetings and local substance abuse treatment through [facility] at discharge.   DIAGNOSIS, PRINCIPAL AND PRIMARY:  AXIS I: Alcohol dependence.   SECONDARY DIAGNOSES: AXIS I: No further.  AXIS II: Deferred.  AXIS III: Acute alcoholic hepatitis, rule out early cirrhosis, alcohol withdrawal.  AXIS IV: Moderate.  AXIS V: Functioning at time of evaluation 40.  ____________________________ Audery AmelJohn T. Clapacs, MD jtc:sb D: 04/14/2014 15:26:24 ET T: 04/14/2014 15:49:44 ET JOB#: [id number]  cc: Audery AmelJohn T. Clapacs, MD, <Dictator> Audery AmelJOHN T CLAPACS MD ELECTRONICALLY SIGNED 04/15/2014 1:49

## 2015-02-10 NOTE — H&P (Signed)
PATIENT NAME:  [name] [name], [name] [name] MR#:  [id number] DATE OF BIRTH:  [birth year]  DATE OF ADMISSION:  06/25/2014  IDENTIFYING INFORMATION AND CHIEF COMPLAINT: This is a 44 year old gentleman with a long history of substance dependence, who presents with a chief complaint, "I need detox."   HISTORY OF PRESENT ILLNESS: Information obtained from the patient and the chart. The patient states that he just got out of [facility] about a week ago. He came home and found that his long-term girlfriend was back to drinking. He triggered a relapse and has been drinking since then. Estimates that he has had over a case of beer for the last couple of days, each day. He initially denied to me that he had used any other drugs, but then corrected himself and said that "maybe" he had used a little heroin at some point. His mood has been very anxious and depressed. He denies suicidal ideation. Poor sleep, poor p.o. intake. He is having visual hallucinations and feeling shaky all over. Lots of negative thoughts about himself. Requests treatment for detoxification.   PAST PSYCHIATRIC HISTORY: The patient denies any history of seizures, but he does have a history of delirium tremens in the past. He has had 1 suicide attempt in the past. He has been treated with antianxiety and antidepressant medicines at times, but major problem appears to be maintaining sobriety. He has had admissions in the past for detoxification and was recently at the alcohol and drug abuse treatment center.   SUBSTANCE DEPENDENCE HISTORY: As mentioned above, long-standing history of alcohol abuse, history of delirium tremens, multiple detoxifications. Also history of intermittent abuse of other drugs.   FAMILY HISTORY: Positive for substance abuse.   SOCIAL HISTORY: Living with a girlfriend recently, who is also abusing substances, but says that he can go find friends locally to live with, who are not using.   PAST MEDICAL HISTORY: No history of heart attacks  or strokes. Does not normally have high blood pressure. No significant ongoing medical problems.   MEDICATIONS: Neurontin 300 mg 3 times a day, Vistaril 50 mg 4 times a day, p.r.n. Ativan for withdrawal.   ALLERGIES: No known drug allergies.   REVIEW OF SYSTEMS: The patient complains of feeling sick to his stomach, dizzy, off balance, having a tremor all over. Positive visual hallucinations of spots and seeing things move around him. General aches and pains. The rest of the full review of systems is negative.   MENTAL STATUS EXAMINATION: Disheveled man who looks his stated age. Passively cooperative with the interview. Eye contact poor. Psychomotor activity very slow. Diffuse tremor all over with intentional tremor. Speech decreased in total amount and flat in tone. Affect flat. Mood stated as bad. Thoughts are very slow, but lucid. No obvious delusional thinking. Denies auditory hallucinations. Positive visual hallucinations. No acute suicidal intent or plan. No homicidal ideation. Judgment and insight adequate. Intelligence baseline normal. Could remember 3/3 objects at 0 minutes, but 0/3 objects at 3 minutes. Otherwise alert and oriented.   PHYSICAL EXAMINATION: GENERAL: The patient looks pretty sick. Clearly off balance when he is walking. Staggers a lot. Has a diffuse tremor throughout with a notable intention tremor.  HEENT: Eyes bloodshot. Face symmetric. Pupils equal and reactive.  SKIN: Diffusely flushed. No acute lesion.  NEUROLOGIC: Strength and reflexes normal and symmetric. Cranial nerves symmetric.  LUNGS: Clear without wheezes.  HEART: Regular rate and rhythm.  ABDOMEN: Soft, nontender, normal bowel sounds.  VITAL SIGNS: Blood pressure 120/79, respirations 18, pulse 78,  temperature 97.4.   LABORATORY RESULTS: Blood alcohol level on admission 362. Drug screen positive for cannabis and benzodiazepines. Chemistry panel: Slightly elevated ALT 110, elevated AST 219. CBC, slightly low  red blood cell count, otherwise normal. Urinalysis unremarkable.   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 Presents acutely intoxicated, multiple outward signs of alcohol withdrawal. Positive history of delirium tremens. Cognitively impaired. The patient requires hospital admission for stabilization of alcohol withdrawal to prevent dangerous outcome.   TREATMENT PLAN: Admit to psychiatry. We had considered referring him to [facility], but it is very unlikely that anything would happen before Tuesday on that. The patient needs more observation and treatment on the unit now. Seizure and fall, and close precautions in place. Detoxification protocol in place. Continue outpatient medicines. Education and supportive counseling done. The patient agrees to the plan.   DIAGNOSIS, PRINCIPAL AND PRIMARY:  AXIS I: Alcohol withdrawal.   SECONDARY DIAGNOSES:  AXIS I: Alcohol abuse. Marijuana abuse, history of opiate abuse, substance-induced mood disorder, depressed.  AXIS II: Deferred.  AXIS III: Alcohol withdrawal.    ____________________________ Audery AmelJohn T. Essense Bousquet, MD jtc:JT D: 06/25/2014 16:12:22 ET T: 06/25/2014 16:36:39 ET JOB#: [id number]  cc: Audery AmelJohn T. Shuntavia Yerby, MD, <Dictator> Audery AmelJOHN T Creg Gilmer MD ELECTRONICALLY SIGNED 06/30/2014 17:02

## 2015-02-10 NOTE — Consult Note (Signed)
Brief Consult Note: Diagnosis: alcohol dependence.   Patient was seen by consultant.   Consult note dictated.   Recommend further assessment or treatment.   Orders entered.   Discussed with Attending MD.   Comments: Psychiatry: PAtient to be admitted for history of DTs and current alcohol detox. [facility] unlikely until after holiday weekend. Needs treatment now.  Electronic Signatures: Audery Amellapacs, Lolitha Tortora T (MD)  (Signed 06-Sep-15 16:05)  Authored: Brief Consult Note   Last Updated: 06-Sep-15 16:05 by Audery Amellapacs, Anthonyjames Bargar T (MD)

## 2015-05-19 IMAGING — CT CT HEAD WITHOUT CONTRAST
1 series · 15 of 30 positions shown, 19 images · non-contrast
Comparison: CT head 04/13/2014.

CLINICAL DATA: Syncopal episode and possible seizure. RIGHT
forehead abrasion.

EXAM:
CT HEAD WITHOUT CONTRAST
TECHNIQUE: Contiguous axial images were obtained from the base of the skull
through the vertex without contrast.

[Series 2: head wo · axial · 0.42mm/px · z∈[-121,+23]mm · 15 of 36 slices shown, 19 images]
[im 2/36  brain]
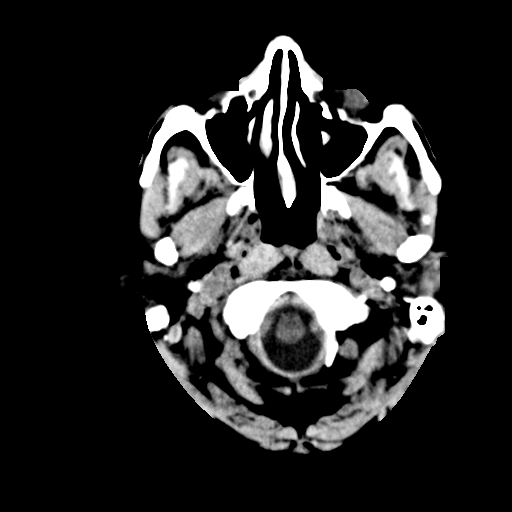
[im 2/36  bone]
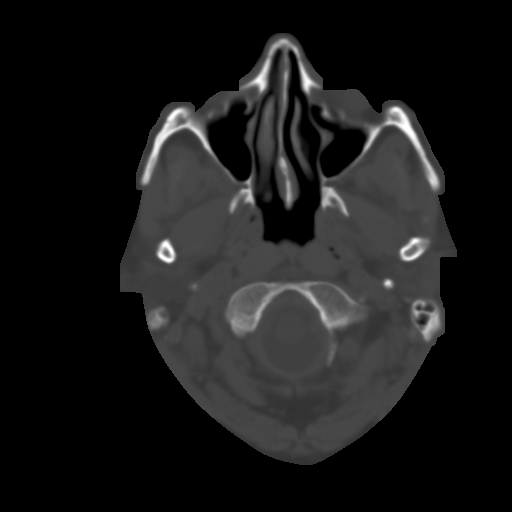
[im 4/36  brain]
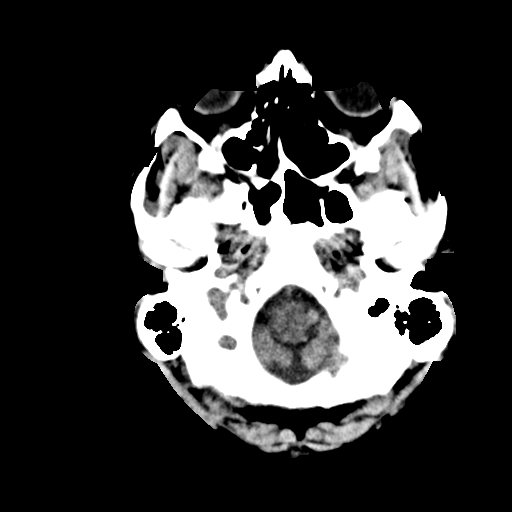
[im 7/36  brain]
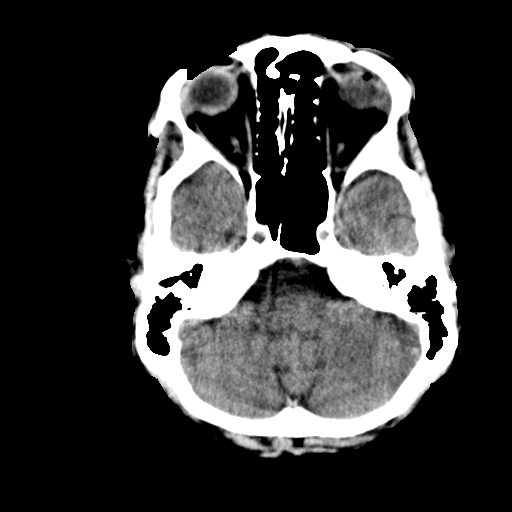
[im 9/36  brain]
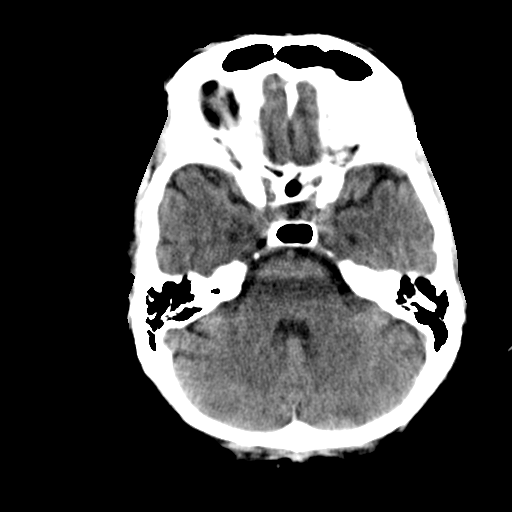
[im 11/36  brain]
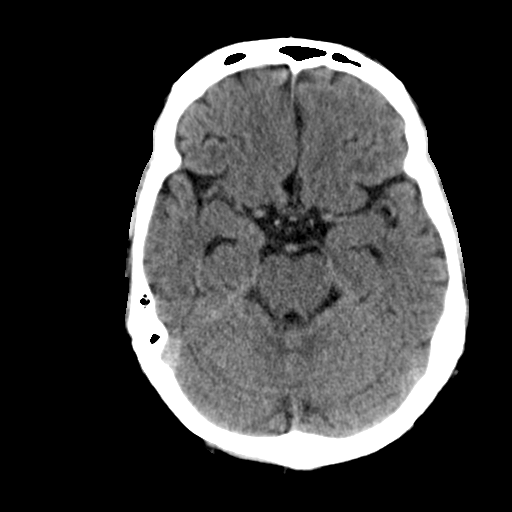
[im 11/36  bone]
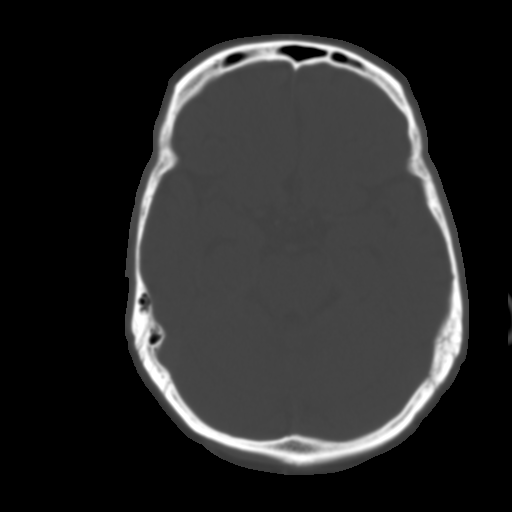
[im 14/36  brain]
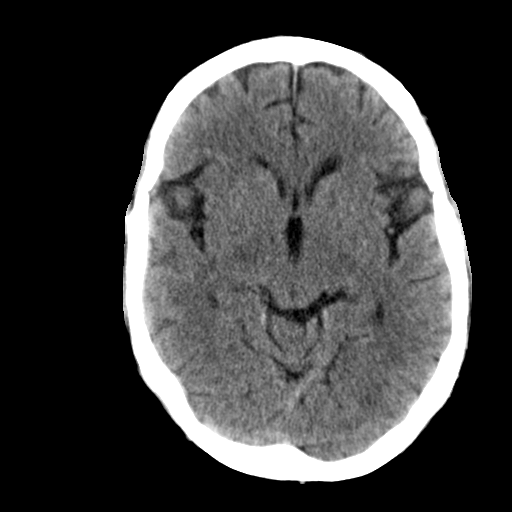
[im 16/36  brain]
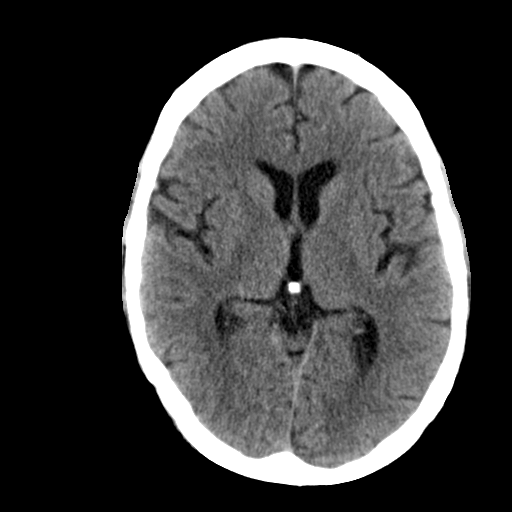
[im 19/36  brain]
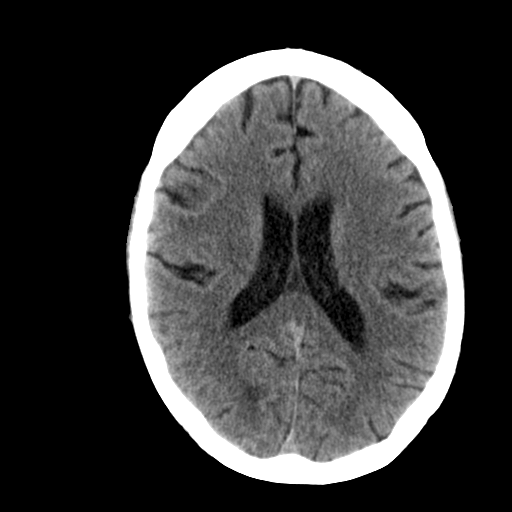
[im 20/36  brain]
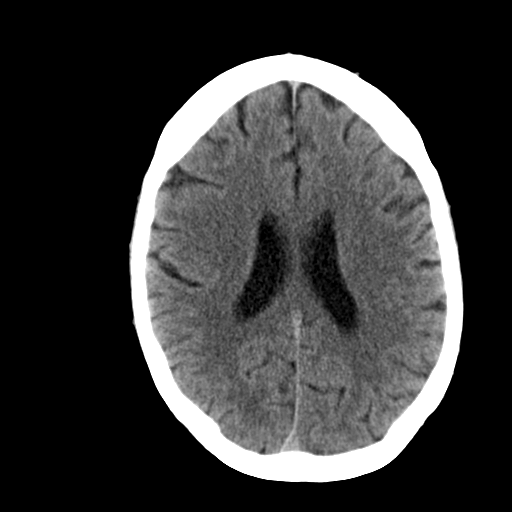
[im 20/36  bone]
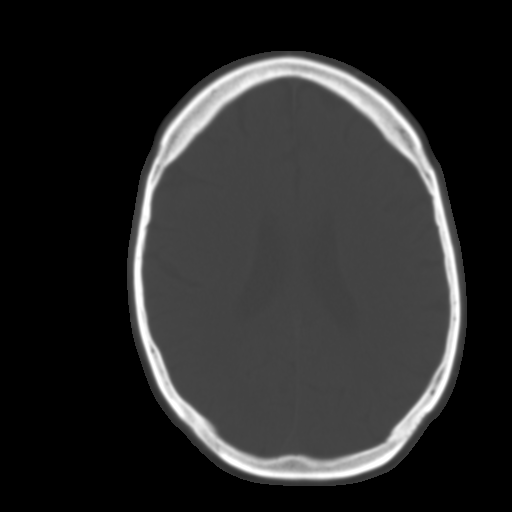
[im 22/36  brain]
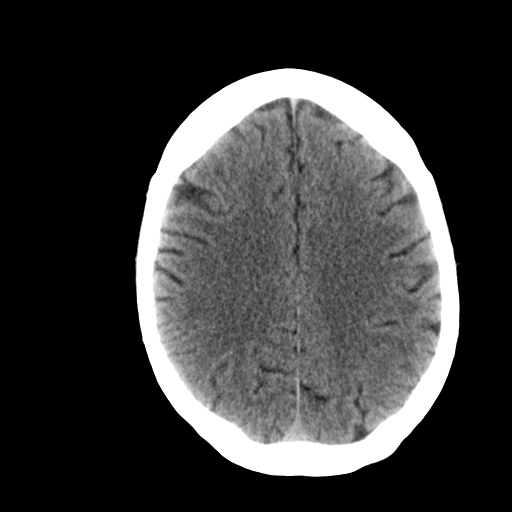
[im 25/36  brain]
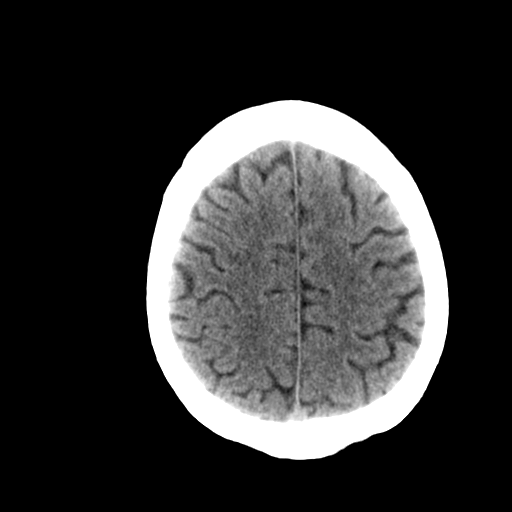
[im 27/36  brain]
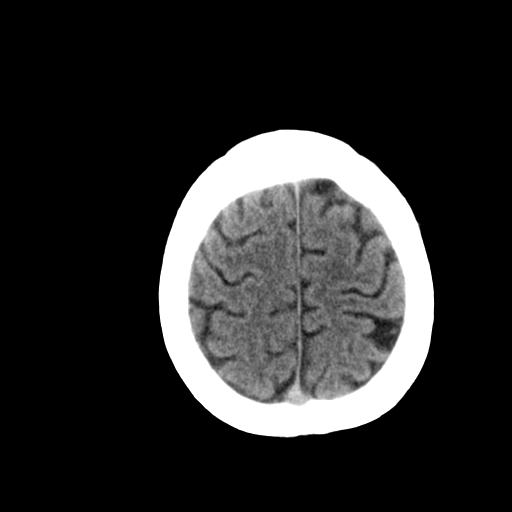
[im 29/36  brain]
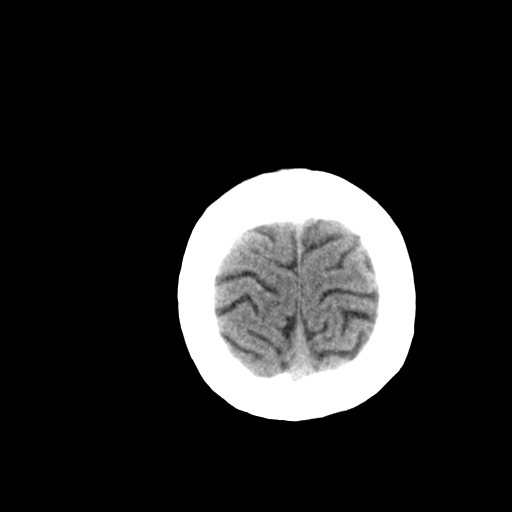
[im 29/36  bone]
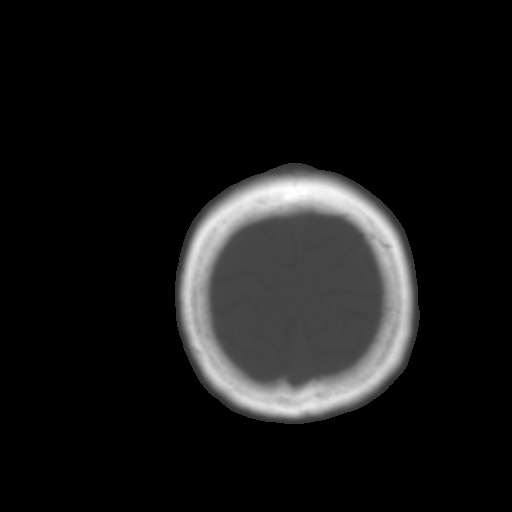
[im 32/36  brain]
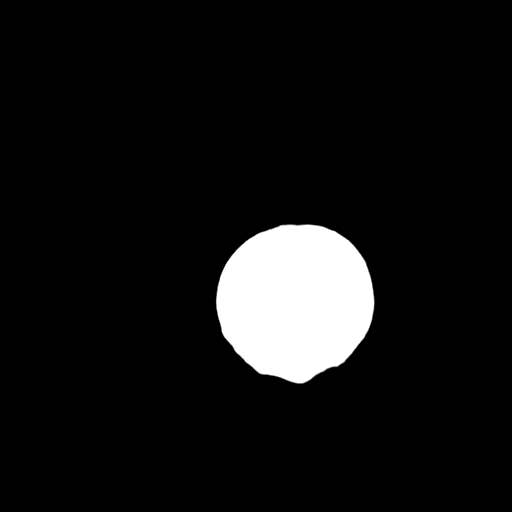
[im 34/36  brain]
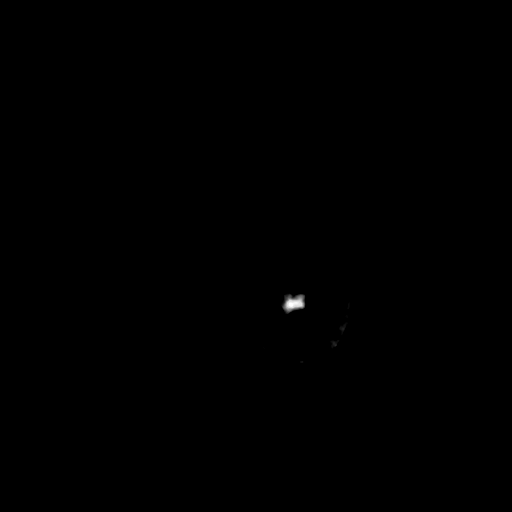

[15 of 30 positions shown; findings below may reference images not displayed]

FINDINGS: No evidence for acute infarction, hemorrhage, mass lesion,
hydrocephalus, or extra-axial fluid. Slight premature for age
cerebral and cerebellar atrophy. Hypoattenuation of white matter
suggesting chronic microvascular ischemic change. Calvarium intact.
RIGHT frontal soft tissue swelling/scalp hematoma.

There is a tiny radiodensity associated with the RIGHT frontal soft
tissue swelling as seen on image 15 series 2. I believe this was
present on the previous CT from 04/13/2014, and not definitely an
acutely imbedded radiodense foreign body. Correlate clinically for
laceration however.

No sinus or mastoid disease.  Negative orbits.
IMPRESSION: Slight premature atrophy.  No acute intracranial findings.

No skull fracture.

Tiny radiodensity associated with RIGHT frontal scalp hematoma not
definitely acute. See discussion above.

## 2015-09-09 ENCOUNTER — Emergency Department
Admission: EM | Admit: 2015-09-09 | Discharge: 2015-09-10 | Disposition: A | Discharge status: Other Acute Inpatient Hospital | Payer: Medicaid Other | Attending: Emergency Medicine | Admitting: Emergency Medicine

## 2015-09-09 DIAGNOSIS — F329 Major depressive disorder, single episode, unspecified: Secondary | ICD-10-CM | POA: Diagnosis not present

## 2015-09-09 DIAGNOSIS — F17200 Nicotine dependence, unspecified, uncomplicated: Secondary | ICD-10-CM | POA: Insufficient documentation

## 2015-09-09 DIAGNOSIS — F1010 Alcohol abuse, uncomplicated: Secondary | ICD-10-CM | POA: Diagnosis present

## 2015-09-09 DIAGNOSIS — R45851 Suicidal ideations: Secondary | ICD-10-CM | POA: Diagnosis not present

## 2015-09-09 DIAGNOSIS — F10929 Alcohol use, unspecified with intoxication, unspecified: Secondary | ICD-10-CM

## 2015-09-09 DIAGNOSIS — F10120 Alcohol abuse with intoxication, uncomplicated: Secondary | ICD-10-CM | POA: Diagnosis not present

## 2015-09-09 DIAGNOSIS — F10129 Alcohol abuse with intoxication, unspecified: Secondary | ICD-10-CM | POA: Diagnosis not present

## 2015-09-09 HISTORY — DX: Depression, unspecified: F32.A

## 2015-09-09 HISTORY — DX: Unspecified cirrhosis of liver: K74.60

## 2015-09-09 HISTORY — DX: Bipolar disorder, current episode depressed, mild or moderate severity, unspecified: F31.30

## 2015-09-09 HISTORY — DX: Major depressive disorder, single episode, unspecified: F32.9

## 2015-09-09 HISTORY — DX: Anxiety disorder, unspecified: F41.9

## 2015-09-09 LAB — CBC WITH DIFFERENTIAL/PLATELET
BASOS ABS: 0.1 10*3/uL (ref 0–0.1)
BASOS PCT: 1 %
EOS ABS: 0.3 10*3/uL (ref 0–0.7)
EOS PCT: 3 %
HCT: 40.1 % (ref 40.0–52.0)
Hemoglobin: 13.8 g/dL (ref 13.0–18.0)
LYMPHS PCT: 30 %
Lymphs Abs: 3.8 10*3/uL — ABNORMAL HIGH (ref 1.0–3.6)
MCH: 31.7 pg (ref 26.0–34.0)
MCHC: 34.4 g/dL (ref 32.0–36.0)
MCV: 92 fL (ref 80.0–100.0)
Monocytes Absolute: 1 10*3/uL (ref 0.2–1.0)
Monocytes Relative: 8 %
Neutro Abs: 7.5 10*3/uL — ABNORMAL HIGH (ref 1.4–6.5)
Neutrophils Relative %: 58 %
PLATELETS: 200 10*3/uL (ref 150–440)
RBC: 4.36 MIL/uL — AB (ref 4.40–5.90)
RDW: 12.5 % (ref 11.5–14.5)
WBC: 12.7 10*3/uL — AB (ref 3.8–10.6)

## 2015-09-09 LAB — COMPREHENSIVE METABOLIC PANEL
ALBUMIN: 4.3 g/dL (ref 3.5–5.0)
ALT: 50 U/L (ref 17–63)
ANION GAP: 9 (ref 5–15)
AST: 76 U/L — ABNORMAL HIGH (ref 15–41)
Alkaline Phosphatase: 64 U/L (ref 38–126)
BILIRUBIN TOTAL: 1.2 mg/dL (ref 0.3–1.2)
BUN: 10 mg/dL (ref 6–20)
CO2: 26 mmol/L (ref 22–32)
Calcium: 8.5 mg/dL — ABNORMAL LOW (ref 8.9–10.3)
Chloride: 103 mmol/L (ref 101–111)
Creatinine, Ser: 0.81 mg/dL (ref 0.61–1.24)
GFR calc Af Amer: >60 mL/min (ref >60)
GFR calc non Af Amer: >60 mL/min (ref >60)
GLUCOSE: 125 mg/dL — AB (ref 65–99)
POTASSIUM: 3.4 mmol/L — AB (ref 3.5–5.1)
Sodium: 138 mmol/L (ref 135–145)
TOTAL PROTEIN: 7.4 g/dL (ref 6.5–8.1)

## 2015-09-09 LAB — URINALYSIS COMPLETE WITH MICROSCOPIC (ARMC ONLY)
BILIRUBIN URINE: NEGATIVE
Bacteria, UA: NONE SEEN
GLUCOSE, UA: NEGATIVE mg/dL
HGB URINE DIPSTICK: NEGATIVE
KETONES UR: NEGATIVE mg/dL
LEUKOCYTES UA: NEGATIVE
NITRITE: NEGATIVE
PH: 5 (ref 5.0–8.0)
Protein, ur: NEGATIVE mg/dL
RBC / HPF: NONE SEEN RBC/hpf (ref 0–5)
Specific Gravity, Urine: 1.008 (ref 1.005–1.030)
Squamous Epithelial / LPF: NONE SEEN

## 2015-09-09 LAB — ETHANOL: ALCOHOL ETHYL (B): 289 mg/dL — AB (ref <5)

## 2015-09-09 LAB — URINE DRUG SCREEN, QUALITATIVE (ARMC ONLY)
Amphetamines, Ur Screen: NOT DETECTED
BARBITURATES, UR SCREEN: NOT DETECTED
BENZODIAZEPINE, UR SCRN: NOT DETECTED
COCAINE METABOLITE, UR ~~LOC~~: NOT DETECTED
Cannabinoid 50 Ng, Ur ~~LOC~~: NOT DETECTED
MDMA (Ecstasy)Ur Screen: NOT DETECTED
Methadone Scn, Ur: NOT DETECTED
OPIATE, UR SCREEN: NOT DETECTED
PHENCYCLIDINE (PCP) UR S: NOT DETECTED
Tricyclic, Ur Screen: NOT DETECTED

## 2015-09-09 LAB — SALICYLATE LEVEL

## 2015-09-09 LAB — ACETAMINOPHEN LEVEL

## 2015-09-09 MED ORDER — VITAMIN B-1 100 MG PO TABS
100.0000 mg | ORAL_TABLET | Freq: Every day | ORAL | Status: DC
  Administered 2015-09-10 (×2): 100 mg via ORAL
  Filled 2015-09-09 (×2): qty 1

## 2015-09-09 MED ORDER — THIAMINE HCL 100 MG/ML IJ SOLN: 100.0000 mg | Freq: Every day | INTRAMUSCULAR | Status: DC

## 2015-09-09 MED ORDER — LORAZEPAM 2 MG PO TABS
0.0000 mg | ORAL_TABLET | Freq: Four times a day (QID) | ORAL | Status: DC
  Administered 2015-09-10: 1 mg via ORAL

## 2015-09-09 MED ORDER — GABAPENTIN 300 MG PO CAPS
300.0000 mg | ORAL_CAPSULE | Freq: Once | ORAL | Status: AC
  Administered 2015-09-09: 300 mg via ORAL
  Filled 2015-09-09: qty 1

## 2015-09-09 MED ORDER — LORAZEPAM 2 MG PO TABS
0.0000 mg | ORAL_TABLET | Freq: Two times a day (BID) | ORAL | Status: DC
  Administered 2015-09-10: 4 mg via ORAL
  Filled 2015-09-09: qty 2

## 2015-09-09 NOTE — ED Provider Notes (Signed)
[facility] Emergency Department Provider Note  Time seen: 8:52 PM  I have reviewed the triage vital signs and the nursing notes.   HISTORY  Chief Complaint Alcohol Problem    HPI [name] [name] is a 44 y.o. male with a past medical history of cirrhosis, depression, anxiety, alcohol abuse who presents the emergency department with suicidal ideation. According to the patient he was recently released from jail one week ago. Since that time he has been drinking alcohol constantly. He states 1.5 cases of beer per day. He lost custody of his 18-year-old daughter. States he has been very depressed since losing custody, and is now suicidal with a plan to jump off a bridge tonight. Patient states his last alcohol intake was approximately one hour ago. Denies any medical complaints at this time.     Past Medical History  Diagnosis Date  . Cirrhosis of liver not due to alcohol (HCC)   . Depression   . Anxiety   . Bipolar affect, depressed (HCC)     There are no active problems to display for this patient.   Past Surgical History  Procedure Laterality Date  . Hernia repair      No current outpatient prescriptions on file.  Allergies Coconut flavor  No family history on file.  Social History Social History  Substance Use Topics  . Smoking status: Current Every Day Smoker  . Smokeless tobacco: None  . Alcohol Use: Yes     Comment: case and 1/2 daily of beer    Review of Systems Constitutional: Negative for fever. Cardiovascular: Negative for chest pain. Respiratory: Negative for shortness of breath. Gastrointestinal: Negative for abdominal pain Musculoskeletal: Negative for back pain. Neurological: Negative for headache 10-point ROS otherwise negative.  ____________________________________________   PHYSICAL EXAM:  VITAL SIGNS: ED Triage Vitals  Enc Vitals Group     BP 09/09/15 2010 123/91 mmHg     Pulse Rate 09/09/15 2010 109   Resp 09/09/15 2010 16     Temp 09/09/15 2010 98.1 F (36.7 C)     Temp Source 09/09/15 2010 Oral     SpO2 09/09/15 2010 96 %     Weight 09/09/15 2010 230 lb (104.327 kg)     Height 09/09/15 2010  (1.778 m)     Head Cir --      Peak Flow --      Pain Score --      Pain Loc --      Pain Edu? --      Excl. in GC? --     Constitutional: Alert and oriented. Well appearing and in no distress. Smells of alcohol, admits recent alcohol intake. Eyes: Normal exam ENT   Head: Normocephalic and atraumatic.   Mouth/Throat: Mucous membranes are moist. Cardiovascular: Normal rate, regular rhythm. No murmur Respiratory: Normal respiratory effort without tachypnea nor retractions. Breath sounds are clear Gastrointestinal: Soft and nontender. No distention.   Musculoskeletal: Nontender with normal range of motion in all extremities.  Neurologic:  Slurred speech. Moves all extremities. He relates without difficulty. Skin:  Skin is warm, dry and intact.  Psychiatric: Mood and affect are normal. Speech and behavior are normal.   ____________________________________________    INITIAL IMPRESSION / ASSESSMENT AND PLAN / ED COURSE  Pertinent labs & imaging results that were available during my care of the patient were reviewed by me and considered in my medical decision making (see chart for details).  Patient resists the emergency department with depression  and suicidal ideation. Patient states he drinks 1.5 cases of beer per day for the past one week. Last alcohol intake was one hour ago. Recently lost custody of his 46-year-old daughter to to alcoholism, is very depressed with a plan to jump off a bridge tonight. We'll place the patient under an involuntary commitment, have the patient evaluated appropriate by psychiatry in the morning.  ____________________________________________   FINAL CLINICAL IMPRESSION(S) / ED DIAGNOSES  Alcohol abuse Depression Suicidal ideation   Minna AntisKevin  Senan Urey, MD 09/09/15 2056

## 2015-09-09 NOTE — BH Assessment (Signed)
Assessment Note  [name] [name] is an 44 y.o. male presenting to the ED voluntarily for alcohol intoxication and suicidal ideations.  Pt reports he was released from jail on Monday and has been drinking non-stop since his release.  Prior to go to jail, he states that he completed a 90 day program at [facility].  Pt reports he has been drinking 1.5 cases of beer a day.  He endorses symptoms of depression since loosing custody of his daughter.  Pt states that is daughter is in the custody of his mother.  Pt reports feelings of despair and states that he had thoughts of jumping off a bridge.  Patient's BAC is 289.  Diagnosis: alcohol Detox  Past Medical History:  Past Medical History  Diagnosis Date  . Cirrhosis of liver not due to alcohol (HCC)   . Depression   . Anxiety   . Bipolar affect, depressed (HCC)     Past Surgical History  Procedure Laterality Date  . Hernia repair      Family History: No family history on file.  Social History:  reports that he has been smoking.  He does not have any smokeless tobacco history on file. He reports that he drinks alcohol. He reports that he does not use illicit drugs.  Additional Social History:  Alcohol / Drug Use History of alcohol / drug use?: Yes Longest period of sobriety (when/how long): 80 days Withdrawal Symptoms: Nausea / Vomiting, Agitation Substance #1 Name of Substance 1: ETOH 1 - Age of First Use: 17 1 - Amount (size/oz): 1 1/2 cases 1 - Frequency: daily 1 - Duration: all day 1 - Last Use / Amount: 09/09/2015  CIWA: CIWA-Ar BP: (!) 123/91 mmHg Pulse Rate: (!) 109 Nausea and Vomiting: mild nausea with no vomiting Tactile Disturbances: none Tremor: not visible, but can be felt fingertip to fingertip Auditory Disturbances: not present Paroxysmal Sweats: no sweat visible Visual Disturbances: not present Anxiety: mildly anxious Headache, Fullness in Head: none present Agitation: normal activity Orientation and  Clouding of Sensorium: oriented and can do serial additions CIWA-Ar Total: 3 COWS:    Allergies:  Allergies  Allergen Reactions  . Coconut Flavor Hives    Home Medications:  (Not in a hospital admission)  OB/GYN Status:  No LMP for male patient.  General Assessment Data Location of Assessment: [facility] ED TTS Assessment: In system Is this a Tele or Face-to-Face Assessment?: Face-to-Face Is this an Initial Assessment or a Re-assessment for this encounter?: Initial Assessment Marital status: Single Maiden name: N/A Is patient pregnant?: No Pregnancy Status: No Living Arrangements: Parent Can pt return to current living arrangement?: Yes Admission Status: Voluntary Is patient capable of signing voluntary admission?: Yes Referral Source: Self/Family/Friend Insurance type: Medicaid  Medical Screening Exam [facility] Walk-in ONLY) Medical Exam completed: Yes  Crisis Care Plan Living Arrangements: Parent Name of Psychiatrist: Dr. Azucena Kuba Name of Therapist: [facility]  Education Status Is patient currently in school?: No Current Grade: N/A Highest grade of school patient has completed: N/A Name of school: N/A Contact person: N/a  Risk to self with the past 6 months Suicidal Ideation: Yes-Currently Present Has patient been a risk to self within the past 6 months prior to admission? : No Suicidal Intent: No Has patient had any suicidal intent within the past 6 months prior to admission? : No Is patient at risk for suicide?: No Suicidal Plan?: No Has patient had any suicidal plan within the past 6 months prior to admission? : No Access  to Means: No What has been your use of drugs/alcohol within the last 12 months?: ETOH Previous Attempts/Gestures: No How many times?: 0 Other Self Harm Risks: N/A Triggers for Past Attempts: None known Intentional Self Injurious Behavior: None Family Suicide History: No Recent stressful life event(s): Loss (Comment), Legal Issues (Lost custody of  daughter) Persecutory voices/beliefs?: No Depression: Yes Depression Symptoms: Loss of interest in usual pleasures, Feeling worthless/self pity Substance abuse history and/or treatment for substance abuse?: Yes Suicide prevention information given to non-admitted patients: Not applicable  Risk to Others within the past 6 months Homicidal Ideation: No Does patient have any lifetime risk of violence toward others beyond the six months prior to admission? : No Thoughts of Harm to Others: No Current Homicidal Intent: No Current Homicidal Plan: No Access to Homicidal Means: No Identified Victim: N/A History of harm to others?: No Assessment of Violence: None Noted Violent Behavior Description: N/A Does patient have access to weapons?: No Criminal Charges Pending?: No Does patient have a court date: No Is patient on probation?: No  Psychosis Hallucinations: None noted Delusions: None noted  Mental Status Report Appearance/Hygiene: Disheveled Eye Contact: Fair Motor Activity: Unsteady Speech: Slurred Level of Consciousness: Drowsy Mood: Worthless, low self-esteem Affect: Depressed Anxiety Level: Minimal Thought Processes: Relevant Judgement: Partial Orientation: Person, Place, Time, Situation Obsessive Compulsive Thoughts/Behaviors: None  Cognitive Functioning Concentration: Fair Memory: Recent Impaired IQ: Average Insight: Fair Impulse Control: Fair Appetite: Fair Weight Loss: 0 Weight Gain: 0 Sleep: No Change Total Hours of Sleep: 4 Vegetative Symptoms: None  ADLScreening [facility] Assessment Services) Patient's cognitive ability adequate to safely complete daily activities?: Yes Patient able to express need for assistance with ADLs?: Yes Independently performs ADLs?: Yes (appropriate for developmental age)  Prior Inpatient Therapy Prior Inpatient Therapy: No Prior Therapy Dates: N/A Prior Therapy Facilty/Provider(s): N/A Reason for Treatment: N/A  Prior  Outpatient Therapy Prior Outpatient Therapy: Yes Prior Therapy Dates: N/a Prior Therapy Facilty/Provider(s): [facility] Reason for Treatment: substance use Does patient have an ACCT team?: No Does patient have Intensive In-House Services?  : No Does patient have [facility] services? : No Does patient have P4CC services?: No  ADL Screening (condition at time of admission) Patient's cognitive ability adequate to safely complete daily activities?: Yes Patient able to express need for assistance with ADLs?: Yes Independently performs ADLs?: Yes (appropriate for developmental age)       Abuse/Neglect Assessment (Assessment to be complete while patient is alone) Physical Abuse: Denies Verbal Abuse: Denies Sexual Abuse: Denies Exploitation of patient/patient's resources: Denies Self-Neglect: Denies Values / Beliefs Cultural Requests During Hospitalization: None Spiritual Requests During Hospitalization: None Consults Spiritual Care Consult Needed: No Social Work Consult Needed: No Merchant navy officerAdvance Directives (For Healthcare) Does patient have an advance directive?: No Would patient like information on creating an advanced directive?: No - patient declined information    Additional Information 1:1 In Past 12 Months?: No CIRT Risk: No Elopement Risk: No Does patient have medical clearance?: Yes     Disposition:  Disposition Initial Assessment Completed for this Encounter: Yes Disposition of Patient: Other dispositions Other disposition(s): Other (Comment) (Psych MD consult; possible referral to [facility])  On Site Evaluation by:   Reviewed with Physician:    Artist Beachoxana C Julianne Chamberlin 09/09/2015 10:53 PM

## 2015-09-09 NOTE — ED Notes (Signed)
Patient here for detox. States he was in [facility] in [place] for 90 days then got out and relapsed. Has been in jail for 80 days, got out on Monday and has been drinking since.

## 2015-09-10 ENCOUNTER — Encounter (HOSPITAL_COMMUNITY): Payer: Self-pay | Admitting: *Deleted

## 2015-09-10 ENCOUNTER — Observation Stay (HOSPITAL_COMMUNITY)
Admission: AD | Admit: 2015-09-10 | Discharge: 2015-09-11 | Disposition: A | Discharge status: Home / Self Care | Payer: Medicaid Other | Source: Intra-hospital | Attending: Psychiatry | Admitting: Psychiatry

## 2015-09-10 DIAGNOSIS — F1024 Alcohol dependence with alcohol-induced mood disorder: Principal | ICD-10-CM | POA: Insufficient documentation

## 2015-09-10 DIAGNOSIS — Z9114 Patient's other noncompliance with medication regimen: Secondary | ICD-10-CM | POA: Insufficient documentation

## 2015-09-10 DIAGNOSIS — F1094 Alcohol use, unspecified with alcohol-induced mood disorder: Secondary | ICD-10-CM | POA: Diagnosis present

## 2015-09-10 DIAGNOSIS — F1020 Alcohol dependence, uncomplicated: Secondary | ICD-10-CM | POA: Diagnosis present

## 2015-09-10 DIAGNOSIS — F17210 Nicotine dependence, cigarettes, uncomplicated: Secondary | ICD-10-CM | POA: Diagnosis not present

## 2015-09-10 DIAGNOSIS — R45851 Suicidal ideations: Secondary | ICD-10-CM | POA: Insufficient documentation

## 2015-09-10 DIAGNOSIS — F419 Anxiety disorder, unspecified: Secondary | ICD-10-CM | POA: Diagnosis not present

## 2015-09-10 DIAGNOSIS — K7460 Unspecified cirrhosis of liver: Secondary | ICD-10-CM | POA: Diagnosis not present

## 2015-09-10 DIAGNOSIS — F10129 Alcohol abuse with intoxication, unspecified: Secondary | ICD-10-CM

## 2015-09-10 DIAGNOSIS — F10229 Alcohol dependence with intoxication, unspecified: Secondary | ICD-10-CM | POA: Insufficient documentation

## 2015-09-10 DIAGNOSIS — F10929 Alcohol use, unspecified with intoxication, unspecified: Secondary | ICD-10-CM | POA: Insufficient documentation

## 2015-09-10 DIAGNOSIS — F329 Major depressive disorder, single episode, unspecified: Secondary | ICD-10-CM | POA: Diagnosis not present

## 2015-09-10 MED ORDER — VITAMIN B-1 100 MG PO TABS
100.0000 mg | ORAL_TABLET | Freq: Every day | ORAL | Status: DC
  Administered 2015-09-11: 100 mg via ORAL
  Filled 2015-09-10: qty 1

## 2015-09-10 MED ORDER — LORAZEPAM 1 MG PO TABS
1.0000 mg | ORAL_TABLET | Freq: Four times a day (QID) | ORAL | Status: DC
  Administered 2015-09-10 – 2015-09-11 (×3): 1 mg via ORAL
  Filled 2015-09-10 (×3): qty 1

## 2015-09-10 MED ORDER — LORAZEPAM 1 MG PO TABS: 1.0000 mg | ORAL_TABLET | Freq: Every day | ORAL | Status: DC

## 2015-09-10 MED ORDER — LOPERAMIDE HCL 2 MG PO CAPS: 2.0000 mg | ORAL_CAPSULE | ORAL | Status: DC | PRN

## 2015-09-10 MED ORDER — LORAZEPAM 1 MG PO TABS
1.0000 mg | ORAL_TABLET | Freq: Two times a day (BID) | ORAL | Status: DC
Start: 2015-09-13 — End: 2015-09-11

## 2015-09-10 MED ORDER — LORAZEPAM 1 MG PO TABS
ORAL_TABLET | ORAL | Status: AC
  Administered 2015-09-10: 1 mg via ORAL
  Filled 2015-09-10: qty 1

## 2015-09-10 MED ORDER — MAGNESIUM HYDROXIDE 400 MG/5ML PO SUSP: 30.0000 mL | Freq: Every day | ORAL | Status: DC | PRN

## 2015-09-10 MED ORDER — ALUM & MAG HYDROXIDE-SIMETH 200-200-20 MG/5ML PO SUSP: 30.0000 mL | ORAL | Status: DC | PRN

## 2015-09-10 MED ORDER — GABAPENTIN 100 MG PO CAPS
200.0000 mg | ORAL_CAPSULE | Freq: Three times a day (TID) | ORAL | Status: DC
  Administered 2015-09-10 – 2015-09-11 (×3): 200 mg via ORAL
  Filled 2015-09-10: qty 2
  Filled 2015-09-10: qty 1
  Filled 2015-09-10 (×2): qty 2

## 2015-09-10 MED ORDER — ADULT MULTIVITAMIN W/MINERALS CH
1.0000 | ORAL_TABLET | Freq: Every day | ORAL | Status: DC
  Administered 2015-09-10 – 2015-09-11 (×2): 1 via ORAL
  Filled 2015-09-10: qty 1

## 2015-09-10 MED ORDER — ACETAMINOPHEN 325 MG PO TABS
650.0000 mg | ORAL_TABLET | Freq: Once | ORAL | Status: AC
  Administered 2015-09-10: 650 mg via ORAL

## 2015-09-10 MED ORDER — TRAZODONE HCL 50 MG PO TABS: 50.0000 mg | ORAL_TABLET | Freq: Every evening | ORAL | Status: DC | PRN

## 2015-09-10 MED ORDER — ACETAMINOPHEN 325 MG PO TABS
650.0000 mg | ORAL_TABLET | Freq: Four times a day (QID) | ORAL | Status: DC | PRN
  Administered 2015-09-10: 650 mg via ORAL
  Filled 2015-09-10: qty 2

## 2015-09-10 MED ORDER — LORAZEPAM 1 MG PO TABS: 1.0000 mg | ORAL_TABLET | Freq: Three times a day (TID) | ORAL | Status: DC

## 2015-09-10 MED ORDER — BUSPIRONE HCL 5 MG PO TABS
5.0000 mg | ORAL_TABLET | Freq: Three times a day (TID) | ORAL | Status: DC
  Administered 2015-09-10 – 2015-09-11 (×3): 5 mg via ORAL
  Filled 2015-09-10 (×3): qty 1

## 2015-09-10 MED ORDER — HYDROXYZINE HCL 25 MG PO TABS
25.0000 mg | ORAL_TABLET | Freq: Four times a day (QID) | ORAL | Status: DC | PRN
  Administered 2015-09-10 – 2015-09-11 (×2): 25 mg via ORAL
  Filled 2015-09-10 (×2): qty 1

## 2015-09-10 MED ORDER — LORAZEPAM 1 MG PO TABS: 1.0000 mg | ORAL_TABLET | Freq: Four times a day (QID) | ORAL | Status: DC | PRN

## 2015-09-10 MED ORDER — ONDANSETRON 4 MG PO TBDP
4.0000 mg | ORAL_TABLET | Freq: Four times a day (QID) | ORAL | Status: DC | PRN
  Administered 2015-09-10: 4 mg via ORAL
  Filled 2015-09-10: qty 1

## 2015-09-10 MED ORDER — LOPERAMIDE HCL 2 MG PO CAPS
2.0000 mg | ORAL_CAPSULE | ORAL | Status: DC | PRN
Start: 2015-09-10 — End: 2015-09-11

## 2015-09-10 MED ORDER — HYDROXYZINE HCL 25 MG PO TABS: 25.0000 mg | ORAL_TABLET | Freq: Four times a day (QID) | ORAL | Status: DC | PRN

## 2015-09-10 MED ORDER — ONDANSETRON 4 MG PO TBDP: 4.0000 mg | ORAL_TABLET | Freq: Four times a day (QID) | ORAL | Status: DC | PRN

## 2015-09-10 MED ORDER — ADULT MULTIVITAMIN W/MINERALS CH: 1.0000 | ORAL_TABLET | Freq: Every day | ORAL | Status: DC

## 2015-09-10 MED ORDER — ACETAMINOPHEN 325 MG PO TABS
ORAL_TABLET | ORAL | Status: AC
  Filled 2015-09-10: qty 2

## 2015-09-10 MED ORDER — CITALOPRAM HYDROBROMIDE 20 MG PO TABS
20.0000 mg | ORAL_TABLET | Freq: Every day | ORAL | Status: DC
  Administered 2015-09-10 – 2015-09-11 (×2): 20 mg via ORAL
  Filled 2015-09-10 (×2): qty 1

## 2015-09-10 MED ORDER — VITAMIN B-1 100 MG PO TABS: 100.0000 mg | ORAL_TABLET | Freq: Every day | ORAL | Status: DC

## 2015-09-10 NOTE — ED Notes (Signed)
Pt sleeping, resps unlabored. Pt without sweating noted, no tremors felt fingertip to fingertip.

## 2015-09-10 NOTE — ED Provider Notes (Signed)
-----------------------------------------   6:16 AM on 09/10/2015 -----------------------------------------   Blood pressure 122/73, pulse 90, temperature 98.2 F (36.8 C), temperature source Oral, resp. rate 14, height 5\' 10"  (1.778 m), weight 230 lb (104.327 kg), SpO2 96 %.  The patient had no acute events since last update.  Calm and cooperative at this time.  Disposition is pending per Psychiatry/Behavioral Medicine team recommendations.     Irean HongJade J Hillary Struss, MD 09/10/15 [phone]

## 2015-09-10 NOTE — ED Notes (Signed)
Pt sleeping, resps unlabored.  

## 2015-09-10 NOTE — ED Notes (Signed)
Pt complains of back pain. Dr. Dolores FrameSung notified, order for tylenol received.

## 2015-09-10 NOTE — ED Notes (Signed)
Patient with discharge instructions, voiced understanding of transfer to [facility], No [facility]/ , belongings given to patient. Patient ambulatory and being [facility].

## 2015-09-10 NOTE — ED Notes (Signed)
BEHAVIORAL HEALTH ROUNDING  Patient sleeping: No.  Patient alert and oriented: yes  Behavior appropriate: Yes. ; If no, describe:  Nutrition and fluids offered: Yes  Toileting and hygiene offered: Yes  Sitter present: not applicable  Law enforcement present: Yes ODS  

## 2015-09-10 NOTE — ED Notes (Addendum)
Patient awake in room,. No noted distress or abnormal behavior. Will continue 15 minute checks and observation by security cameras for safety.

## 2015-09-10 NOTE — Consult Note (Signed)
[facility] Face-to-Face Psychiatry Consult   Reason for Consult: Alcohol Dependence Referring Physician:  Lenise Arena, M.D. Patient Identification: [name] [name] MRN:  [id number] Principal Diagnosis: Alcohol  use disorder severe                                     Alcohol-induced mood disorder  Diagnosis: Alcohol  use disorder severe                                     Alcohol-induced mood disorder   Total Time spent with patient: 1 hour  Subjective:   [name] [name] is a 44 y.o. male patient admitted with alcohol use after he was recently released from the jail on last Monday.  HPI:  Patient is a 44 year old male who was recently released from the jail last Monday and he relapsed on beer. He reported that he has problems with drinking and wants detox and get off alcohol. He reported that he has been drinking all day long. Most of the history was obtained from the patient as well as review of his records.  Patient stated that he has been diagnosed with cirrhosis of the liver approximately one year ago while he was being detoxed at [place]. Patient stated that he was prescribed BuSpar Celexa and Neurontin at the [facility] but he remains noncompliant with his medications. He stated that he was incarcerated for 8 days due to DWI Patient stated that he does not have a place to go at this time.  He has history of shakes and blackouts in the past. He denied any history of seizures. He stated that he feels depressed hopeless at this time but denied having any suicidal ideations or plans. He is interested in going voluntarily to the rehabilitation program. Patient stated that he has history of multiple  rehabilitation programs in the past as well. He stated that he has history of impulsive behavior as well. He currently denied having any perceptual disturbances.  Past Psychiatric History:   Patient denied any history of seizures but has history of delirium tremens in the past. He reported one  history of suicide attempt. He was treated with antianxiety and depression medications in the past but his major problem remains alcohol use. Patient has history of admissions for detoxification and has attended [facility], [facility] as well as [facility]. He reported that he was for almost 80 days at the [facility] in the past.  Risk to Self: Suicidal Ideation: Yes-Currently Present Suicidal Intent: No Is patient at risk for suicide?: No Suicidal Plan?: No Access to Means: No What has been your use of drugs/alcohol within the last 12 months?: ETOH How many times?: 0 Other Self Harm Risks: N/A Triggers for Past Attempts: None known Intentional Self Injurious Behavior: None Risk to Others: Homicidal Ideation: No Thoughts of Harm to Others: No Current Homicidal Intent: No Current Homicidal Plan: No Access to Homicidal Means: No Identified Victim: N/A History of harm to others?: No Assessment of Violence: None Noted Violent Behavior Description: N/A Does patient have access to weapons?: No Criminal Charges Pending?: No Does patient have a court date: No Prior Inpatient Therapy: Prior Inpatient Therapy: No Prior Therapy Dates: N/A Prior Therapy Facilty/Provider(s): N/A Reason for Treatment: N/A Prior Outpatient Therapy: Prior Outpatient Therapy: Yes Prior Therapy Dates: N/a Prior Therapy Facilty/Provider(s):  [facility] Reason for Treatment: substance use Does patient have an ACCT team?: No Does patient have Intensive In-House Services?  : No Does patient have [facility] services? : No Does patient have P4CC services?: No  Past Medical History:  Past Medical History  Diagnosis Date  . Cirrhosis of liver not due to alcohol ([place])   . Depression   . Anxiety   . Bipolar affect, depressed ([place])     Past Surgical History  Procedure Laterality Date  . Hernia repair     Family History: No family history on file. Family Psychiatric  History:  Positive for substance abuse.  Social  History:  History  Alcohol Use  . Yes    Comment: case and 1/2 daily of beer     History  Drug Use No    Social History   Social History  . Marital Status: Single    Spouse Name: N/A  . Number of Children: N/A  . Years of Education: N/A   Social History Main Topics  . Smoking status: Current Every Day Smoker  . Smokeless tobacco: None  . Alcohol Use: Yes     Comment: case and 1/2 daily of beer  . Drug Use: No  . Sexual Activity: Not Asked   Other Topics Concern  . None   Social History Narrative  . None   Additional Social History:    History of alcohol / drug use?: Yes Longest period of sobriety (when/how long): 80 days Withdrawal Symptoms: Nausea / Vomiting, Agitation Name of Substance 1: ETOH 1 - Age of First Use: 17 1 - Amount (size/oz): 1 1/2 cases 1 - Frequency: daily 1 - Duration: all day 1 - Last Use / Amount: 09/09/2015       patient reported that he was married one time in the past. He stated that he has 4 children ages 34,26, 51 and  38     all the children have different mothers.          Allergies:   Allergies  Allergen Reactions  . Coconut Flavor Hives    Labs:  Results for orders placed or performed during the hospital encounter of 09/09/15 (from the past 48 hour(s))  Comprehensive metabolic panel     Status: Abnormal   Collection Time: 09/09/15  9:05 PM  Result Value Ref Range   Sodium 138 135 - 145 mmol/L   Potassium 3.4 (L) 3.5 - 5.1 mmol/L   Chloride 103 101 - 111 mmol/L   CO2 26 22 - 32 mmol/L   Glucose, Bld 125 (H) 65 - 99 mg/dL   BUN 10 6 - 20 mg/dL   Creatinine, Ser 0.81 0.61 - 1.24 mg/dL   Calcium 8.5 (L) 8.9 - 10.3 mg/dL   Total Protein 7.4 6.5 - 8.1 g/dL   Albumin 4.3 3.5 - 5.0 g/dL   AST 76 (H) 15 - 41 U/L   ALT 50 17 - 63 U/L   Alkaline Phosphatase 64 38 - 126 U/L   Total Bilirubin 1.2 0.3 - 1.2 mg/dL   GFR calc non Af Amer >60 >60 mL/min   GFR calc Af Amer >60 >60 mL/min    Comment: (NOTE) The eGFR has been  calculated using the CKD EPI equation. This calculation has not been validated in all clinical situations. eGFR's persistently <60 mL/min signify possible Chronic Kidney Disease.    Anion gap 9 5 - 15  Ethanol     Status: Abnormal   Collection Time: 09/09/15  9:05  PM  Result Value Ref Range   Alcohol, Ethyl (B) 289 (H) <5 mg/dL    Comment:        LOWEST DETECTABLE LIMIT FOR SERUM ALCOHOL IS 5 mg/dL FOR MEDICAL PURPOSES ONLY   Salicylate level     Status: None   Collection Time: 09/09/15  9:05 PM  Result Value Ref Range   Salicylate Lvl <6.7 2.8 - 30.0 mg/dL  Acetaminophen level     Status: Abnormal   Collection Time: 09/09/15  9:05 PM  Result Value Ref Range   Acetaminophen (Tylenol), Serum <10 (L) 10 - 30 ug/mL    Comment:        THERAPEUTIC CONCENTRATIONS VARY SIGNIFICANTLY. A RANGE OF 10-30 ug/mL MAY BE AN EFFECTIVE CONCENTRATION FOR MANY PATIENTS. HOWEVER, SOME ARE BEST TREATED AT CONCENTRATIONS OUTSIDE THIS RANGE. ACETAMINOPHEN CONCENTRATIONS >150 ug/mL AT 4 HOURS AFTER INGESTION AND >50 ug/mL AT 12 HOURS AFTER INGESTION ARE OFTEN ASSOCIATED WITH TOXIC REACTIONS.   CBC with Differential     Status: Abnormal   Collection Time: 09/09/15  9:05 PM  Result Value Ref Range   WBC 12.7 (H) 3.8 - 10.6 K/uL   RBC 4.36 (L) 4.40 - 5.90 MIL/uL   Hemoglobin 13.8 13.0 - 18.0 g/dL   HCT 40.1 40.0 - 52.0 %   MCV 92.0 80.0 - 100.0 fL   MCH 31.7 26.0 - 34.0 pg   MCHC 34.4 32.0 - 36.0 g/dL   RDW 12.5 11.5 - 14.5 %   Platelets 200 150 - 440 K/uL   Neutrophils Relative % 58 %   Neutro Abs 7.5 (H) 1.4 - 6.5 K/uL   Lymphocytes Relative 30 %   Lymphs Abs 3.8 (H) 1.0 - 3.6 K/uL   Monocytes Relative 8 %   Monocytes Absolute 1.0 0.2 - 1.0 K/uL   Eosinophils Relative 3 %   Eosinophils Absolute 0.3 0 - 0.7 K/uL   Basophils Relative 1 %   Basophils Absolute 0.1 0 - 0.1 K/uL  Urinalysis complete, with microscopic     Status: Abnormal   Collection Time: 09/09/15 11:05 PM  Result  Value Ref Range   Color, Urine YELLOW (A) YELLOW   APPearance CLEAR (A) CLEAR   Glucose, UA NEGATIVE NEGATIVE mg/dL   Bilirubin Urine NEGATIVE NEGATIVE   Ketones, ur NEGATIVE NEGATIVE mg/dL   Specific Gravity, Urine 1.008 1.005 - 1.030   Hgb urine dipstick NEGATIVE NEGATIVE   pH 5.0 5.0 - 8.0   Protein, ur NEGATIVE NEGATIVE mg/dL   Nitrite NEGATIVE NEGATIVE   Leukocytes, UA NEGATIVE NEGATIVE   RBC / HPF NONE SEEN 0 - 5 RBC/hpf   WBC, UA 0-5 0 - 5 WBC/hpf   Bacteria, UA NONE SEEN NONE SEEN   Squamous Epithelial / LPF NONE SEEN NONE SEEN   Mucous PRESENT   Urine Drug Screen, Qualitative     Status: None   Collection Time: 09/09/15 11:05 PM  Result Value Ref Range   Tricyclic, Ur Screen NONE DETECTED NONE DETECTED   Amphetamines, Ur Screen NONE DETECTED NONE DETECTED   MDMA (Ecstasy)Ur Screen NONE DETECTED NONE DETECTED   Cocaine Metabolite,Ur [place] NONE DETECTED NONE DETECTED   Opiate, Ur Screen NONE DETECTED NONE DETECTED   Phencyclidine (PCP) Ur S NONE DETECTED NONE DETECTED   Cannabinoid 50 Ng, Ur [place] NONE DETECTED NONE DETECTED   Barbiturates, Ur Screen NONE DETECTED NONE DETECTED   Benzodiazepine, Ur Scrn NONE DETECTED NONE DETECTED   Methadone Scn, Ur NONE DETECTED NONE DETECTED  Comment: (NOTE) 086  Tricyclics, urine               Cutoff 1000 ng/mL 200  Amphetamines, urine             Cutoff 1000 ng/mL 300  MDMA (Ecstasy), urine           Cutoff 500 ng/mL 400  Cocaine Metabolite, urine       Cutoff 300 ng/mL 500  Opiate, urine                   Cutoff 300 ng/mL 600  Phencyclidine (PCP), urine      Cutoff 25 ng/mL 700  Cannabinoid, urine              Cutoff 50 ng/mL 800  Barbiturates, urine             Cutoff 200 ng/mL 900  Benzodiazepine, urine           Cutoff 200 ng/mL 1000 Methadone, urine                Cutoff 300 ng/mL 1100 1200 The urine drug screen provides only a preliminary, unconfirmed 1300 analytical test result and should not be used for non-medical 1400  purposes. Clinical consideration and professional judgment should 1500 be applied to any positive drug screen result due to possible 1600 interfering substances. A more specific alternate chemical method 1700 must be used in order to obtain a confirmed analytical result.  1800 Gas chromato graphy / mass spectrometry (GC/MS) is the preferred 1900 confirmatory method.     Current Facility-Administered Medications  Medication Dose Route Frequency Provider Last Rate Last Dose  . acetaminophen (TYLENOL) 325 MG tablet           . LORazepam (ATIVAN) tablet 0-4 mg  0-4 mg Oral 4 times per day Harvest Dark, MD      . LORazepam (ATIVAN) tablet 0-4 mg  0-4 mg Oral Q12H Harvest Dark, MD   4 mg at 09/10/15 0914  . thiamine (VITAMIN B-1) tablet 100 mg  100 mg Oral Daily Harvest Dark, MD   100 mg at 09/10/15 7619   No current outpatient prescriptions on file.    Musculoskeletal: Strength & Muscle Tone: within normal limits Gait & Station: normal Patient leans: N/A  Psychiatric Specialty Exam: Review of Systems  Psychiatric/Behavioral: Positive for depression and substance abuse. The patient is nervous/anxious.   All other systems reviewed and are negative.   Blood pressure 123/72, pulse 91, temperature 97.8 F (36.6 C), temperature source Oral, resp. rate 18, height _0  (1.778 m), weight 230 lb (104.327 kg), SpO2 97 %.Body mass index is 33 kg/(m^2).  General Appearance: Casual  Eye Contact::  Fair  Speech:  Clear and Coherent and Normal Rate  Volume:  Decreased  Mood:  Depressed  Affect:  Congruent  Thought Process:  Coherent and Logical  Orientation:  Full (Time, Place, and Person)  Thought Content:  WDL  Suicidal Thoughts:  No  Homicidal Thoughts:  No  Memory:  Immediate;   Fair  Judgement:  Fair  Insight:  Lacking  Psychomotor Activity:  Psychomotor Retardation  Concentration:  Fair  Recall:  [employer] of Knowledge:Fair  Language: Fair  Akathisia:  No   Handed:  Right  AIMS (if indicated):     Assets:  Communication Skills Desire for Improvement Social Support  ADL's:  Intact  Cognition: WNL  Sleep:      Treatment Plan Summary: Medication management  Disposition: Refer to IOP. Discussed crisis plan, support from social network, calling 911, coming to the Emergency Department, and calling Suicide Hotline.   Patient will be referred to the behavioral health observation unit in [place]. I will release him from the involuntary commitment at this time as he does not meet the criteria and patient also contracted for safety and currently denied having any suicidal homicidal ideations or plans He is not experiencing any withdrawal symptoms at this time and he will complete detox at the behavioral health observation unit in [place] He will go there on a voluntary basis Case discussed with the ED physician Dr. Jimmye Norman and and the [facility] staff and pt  agreed with the plan Patient will be discharged when he becomes clinically stable Thank you for allowing me to participate in the care of this patient    This note was generated in part or whole with voice recognition software. Voice regonition is usually quite accurate but there are transcription errors that can and very often do occur. I apologize for any typographical errors that were not detected and corrected.   Rainey Pines, MD  09/10/2015 10:14 AM

## 2015-09-10 NOTE — ED Notes (Signed)
Patient sleeping with unlabored respirations.  No sweating noted.  No hand tremors noted.  Full CIWA scale not performed at this time.

## 2015-09-10 NOTE — ED Notes (Signed)
Patient with discharge orders, nurse administered 1 mg of ativan prior to discharge at 1220, patient noted with have mild tremors and sweaty palms, patient with some anxiety, states that He usually take buspar, celexa, and neurontin, Nurse did report this to receiving nurse KIM [facility] at [name].

## 2015-09-10 NOTE — BH Assessment (Addendum)
Pt. has been accepted to [facility] Observ[facility].  Accepting physician is Dr. Lucianne MussKumar.  Call report to [phone] or [phone].  Representative was [employer].  ER Staff Misty Stanley(Lisa, ER Sect.; Dr. Mayford KnifeWilliams, ER MD & Amy H. Patient's Nurse) have been made aware it.    Patient will transport, via [facility] ([phone])  Patient signed Obs, "Voluntary Admission Consent for Treatment" and copy placed on his paper chart. Writer completed [facility] Referral form and faxed them a copy. Spoke with [facility] ([phone]) about the patient going to Obs for 23 hours, to wait out the time. Patient was initially declined by [facility] due to being under IVC. He have to be off of IVC for 24 hours before he can be considered for admission with them. Per Eber Jonesarolyn, at [facility], they have beds at this time. They also have several planned discharges tomorrow morning (09/11/2015). Was advised to call back in the morning and they should be able to take him then.

## 2015-09-10 NOTE — ED Notes (Signed)
Patient being discharged , nurse called report to Selena BattenKim RN at behavioral Health at [facility].

## 2015-09-10 NOTE — ED Notes (Signed)
BEHAVIORAL HEALTH ROUNDING Patient sleeping: No. Patient alert and oriented: yes Behavior appropriate: Yes.  ; If no, describe:  Nutrition and fluids offered: Yes  Toileting and hygiene offered: Yes  Sitter present: yes Law enforcement present: Yes  

## 2015-09-10 NOTE — ED Notes (Signed)
Report to wendy lancaster, rn. 

## 2015-09-10 NOTE — ED Notes (Signed)

## 2015-09-10 NOTE — BHH Counselor (Signed)
[facility] Assessment Progress Note  Counselor called [facility] [phone]) and was advised that, after further review, [name] JonesCarolyn decided that pt could not be accepted there as he has an assault on a male charge and they have several females currently in the program.   Counselor advised pt of the disposition. Pt was accepting of the information and stated that he would just go to the [facility] in [place], where he knows he can return. Pt also indicated that he can call his brother to pick him up to take him. Pt is aware that he will be in OBS overnight and will be d/c tomorrow, provided there are no adverse concerns from the NP and/or psychiatrist.   [name] ShockSamantha M. Ladona Ridgelaylor, MS, NCC, LPCA Counselor

## 2015-09-10 NOTE — BHH Counselor (Signed)
Call received from TheresaSandy with [facility].  Pt is approved for admission after he has been IVC for 24 hours and can contract for safety.

## 2015-09-10 NOTE — ED Notes (Signed)
Patient received 1 mg of ativan prior to discharge due to ciwa -alcohol scale rated 5.

## 2015-09-10 NOTE — Progress Notes (Signed)
Patient ID: Juel BurrowRobert J Ticer, male   DOB: 09/25/1971, 44 y.o.   MRN: [id number] Admission Note: [facility]for alcohol intoxication and suicidal ideation with plan to jump off bridge. Pt reports he was released from jail on Monday and has been drinking non-stop since his release. Prior to go to jail, he states that he completed a 90 day program [facility]. Pt reports he has been drinking 1.5 cases of beer a day. He endorses symptoms of depression since losing custody of his daughter and losing his girlfriend. Pt states that is daughter is in the custody of his mother.Patient has hx of DUI where police officers wife was injured. Patient flat and depressed, cooperative and pleasant.  Denies HI and AVH. Has gained 40 lbs in last 80 days while in prison. Oriented to unit.

## 2015-09-10 NOTE — Progress Notes (Signed)
B[facility] INPATIENT:  Family/Significant Other Suicide Prevention Education  Suicide Prevention Education:  Patient Refusal for Family/Significant Other Suicide Prevention Education: The patient [name] [name] has refused to provide written consent for family/significant other to be provided Family/Significant Other Suicide Prevention Education during admission and/or prior to discharge.  Physician notified.  [name], Darik Massing J 09/10/2015, 3:24 PM

## 2015-09-10 NOTE — Consult Note (Deleted)
[name] [name] MRN:  [id number] Principal Diagnosis: Alcohol  use disorder severe                                     Alcohol-induced mood disorder  Diagnosis: Alcohol  use disorder severe                                     Alcohol-induced mood disorder  Total Time spent with patient: 45 minutes  Subjective:   [name] [name] is a 44 y.o. male patient admitted with alcohol use after he was recently released from the jail on last Monday.  HPI:  [name] [name] is a 44 year old male who was recently released from the jail last Monday and he relapsed on beer. He reported that he has problems with drinking and wants detox and get off alcohol. He reported that he has been drinking all day long. Most of the history was obtained from the patient as well as review of his records. Patient stated that he has been diagnosed with cirrhosis of the liver approximately one year ago while he was being detoxed at [facility]. Patient stated that he was prescribed BuSpar Celexa and Neurontin at the [facility] but he remains noncompliant with his medications. He stated that he was incarcerated for 8 days due to DWI Patient stated that he does not have a place to go at this time.  He has history of shakes and blackouts in the past. He denied any history of seizures. He stated that he feels depressed hopeless at this time but denied having any suicidal ideations or plans. He is interested in going voluntarily to the rehabilitation program. Patient stated that he has history of multiple  rehabilitation programs in the past as well. He stated that he has history of impulsive behavior as well. He currently denied having any perceptual disturbances.  Past Psychiatric History:   Patient denied any history of seizures but has history of delirium tremens in the past. He reported one history of suicide attempt. He was treated with antianxiety and depression medications in  the past but his major problem remains alcohol use. Patient has history of admissions for detoxification and has attended [facility], [facility] as well as [facility]. He reported that he was for almost 80 days at the [facility] in the past.  Risk to Self: Is patient at risk for suicide?: No Risk to Others:   Prior Inpatient Therapy:   Prior Outpatient Therapy:    Past Medical History:  Past Medical History  Diagnosis Date  . Cirrhosis of liver not due to alcohol ([place])   . Depression   . Anxiety   . Bipolar affect, depressed ([place])     Past Surgical History  Procedure Laterality Date  . Hernia repair     Family History: History reviewed. No pertinent family history. Family Psychiatric  History:  Positive for substance abuse.  Social History:  History  Alcohol Use  . Yes    Comment: case and 1/2 daily of beer     History  Drug Use No    Social History   Social History  . Marital Status: Single    Spouse Name: N/A  . Number of Children: N/A  . Years of Education: N/A   Social History  Main Topics  . Smoking status: Current Every Day Smoker  . Smokeless tobacco: None  . Alcohol Use: Yes     Comment: case and 1/2 daily of beer  . Drug Use: No  . Sexual Activity: Not Asked   Other Topics Concern  . None   Social History Narrative   Additional Social History:    History of alcohol / drug use?: Yes Longest period of sobriety (when/how long): 80 days Negative Consequences of Use: Museum/gallery curator, Scientist, research (physical sciences), Personal relationships, Work / School Withdrawal Symptoms: Nausea / Vomiting, Agitation Name of Substance 1: ETOH 1 - Age of First Use: 17 1 - Amount (size/oz): 1 1/2 cases 1 - Frequency: daily 1 - Duration: all day 1 - Last Use / Amount: 09/09/2015       patient reported that he was married one time in the past. He stated that he has 4 children ages 42,26, 26 and  62     all the children have different mothers.          Allergies:   Allergies  Allergen  Reactions  . Coconut Flavor Hives    Labs:  Results for orders placed or performed during the hospital encounter of 09/09/15 (from the past 48 hour(s))  Comprehensive metabolic panel     Status: Abnormal   Collection Time: 09/09/15  9:05 PM  Result Value Ref Range   Sodium 138 135 - 145 mmol/L   Potassium 3.4 (L) 3.5 - 5.1 mmol/L   Chloride 103 101 - 111 mmol/L   CO2 26 22 - 32 mmol/L   Glucose, Bld 125 (H) 65 - 99 mg/dL   BUN 10 6 - 20 mg/dL   Creatinine, Ser 0.81 0.61 - 1.24 mg/dL   Calcium 8.5 (L) 8.9 - 10.3 mg/dL   Total Protein 7.4 6.5 - 8.1 g/dL   Albumin 4.3 3.5 - 5.0 g/dL   AST 76 (H) 15 - 41 U/L   ALT 50 17 - 63 U/L   Alkaline Phosphatase 64 38 - 126 U/L   Total Bilirubin 1.2 0.3 - 1.2 mg/dL   GFR calc non Af Amer >60 >60 mL/min   GFR calc Af Amer >60 >60 mL/min    Comment: (NOTE) The eGFR has been calculated using the CKD EPI equation. This calculation has not been validated in all clinical situations. eGFR's persistently <60 mL/min signify possible Chronic Kidney Disease.    Anion gap 9 5 - 15  Ethanol     Status: Abnormal   Collection Time: 09/09/15  9:05 PM  Result Value Ref Range   Alcohol, Ethyl (B) 289 (H) <5 mg/dL    Comment:        LOWEST DETECTABLE LIMIT FOR SERUM ALCOHOL IS 5 mg/dL FOR MEDICAL PURPOSES ONLY   Salicylate level     Status: None   Collection Time: 09/09/15  9:05 PM  Result Value Ref Range   Salicylate Lvl <5.7 2.8 - 30.0 mg/dL  Acetaminophen level     Status: Abnormal   Collection Time: 09/09/15  9:05 PM  Result Value Ref Range   Acetaminophen (Tylenol), Serum <10 (L) 10 - 30 ug/mL    Comment:        THERAPEUTIC CONCENTRATIONS VARY SIGNIFICANTLY. A RANGE OF 10-30 ug/mL MAY BE AN EFFECTIVE CONCENTRATION FOR MANY PATIENTS. HOWEVER, SOME ARE BEST TREATED AT CONCENTRATIONS OUTSIDE THIS RANGE. ACETAMINOPHEN CONCENTRATIONS >150 ug/mL AT 4 HOURS AFTER INGESTION AND >50 ug/mL AT 12 HOURS AFTER INGESTION ARE OFTEN ASSOCIATED WITH  TOXIC  REACTIONS.   CBC with Differential     Status: Abnormal   Collection Time: 09/09/15  9:05 PM  Result Value Ref Range   WBC 12.7 (H) 3.8 - 10.6 K/uL   RBC 4.36 (L) 4.40 - 5.90 MIL/uL   Hemoglobin 13.8 13.0 - 18.0 g/dL   HCT 40.1 40.0 - 52.0 %   MCV 92.0 80.0 - 100.0 fL   MCH 31.7 26.0 - 34.0 pg   MCHC 34.4 32.0 - 36.0 g/dL   RDW 12.5 11.5 - 14.5 %   Platelets 200 150 - 440 K/uL   Neutrophils Relative % 58 %   Neutro Abs 7.5 (H) 1.4 - 6.5 K/uL   Lymphocytes Relative 30 %   Lymphs Abs 3.8 (H) 1.0 - 3.6 K/uL   Monocytes Relative 8 %   Monocytes Absolute 1.0 0.2 - 1.0 K/uL   Eosinophils Relative 3 %   Eosinophils Absolute 0.3 0 - 0.7 K/uL   Basophils Relative 1 %   Basophils Absolute 0.1 0 - 0.1 K/uL  Urinalysis complete, with microscopic     Status: Abnormal   Collection Time: 09/09/15 11:05 PM  Result Value Ref Range   Color, Urine YELLOW (A) YELLOW   APPearance CLEAR (A) CLEAR   Glucose, UA NEGATIVE NEGATIVE mg/dL   Bilirubin Urine NEGATIVE NEGATIVE   Ketones, ur NEGATIVE NEGATIVE mg/dL   Specific Gravity, Urine 1.008 1.005 - 1.030   Hgb urine dipstick NEGATIVE NEGATIVE   pH 5.0 5.0 - 8.0   Protein, ur NEGATIVE NEGATIVE mg/dL   Nitrite NEGATIVE NEGATIVE   Leukocytes, UA NEGATIVE NEGATIVE   RBC / HPF NONE SEEN 0 - 5 RBC/hpf   WBC, UA 0-5 0 - 5 WBC/hpf   Bacteria, UA NONE SEEN NONE SEEN   Squamous Epithelial / LPF NONE SEEN NONE SEEN   Mucous PRESENT   Urine Drug Screen, Qualitative     Status: None   Collection Time: 09/09/15 11:05 PM  Result Value Ref Range   Tricyclic, Ur Screen NONE DETECTED NONE DETECTED   Amphetamines, Ur Screen NONE DETECTED NONE DETECTED   MDMA (Ecstasy)Ur Screen NONE DETECTED NONE DETECTED   Cocaine Metabolite,Ur Casey NONE DETECTED NONE DETECTED   Opiate, Ur Screen NONE DETECTED NONE DETECTED   Phencyclidine (PCP) Ur S NONE DETECTED NONE DETECTED   Cannabinoid 50 Ng, Ur [place] NONE DETECTED NONE DETECTED   Barbiturates, Ur Screen NONE  DETECTED NONE DETECTED   Benzodiazepine, Ur Scrn NONE DETECTED NONE DETECTED   Methadone Scn, Ur NONE DETECTED NONE DETECTED    Comment: (NOTE) 734  Tricyclics, urine               Cutoff 1000 ng/mL 200  Amphetamines, urine             Cutoff 1000 ng/mL 300  MDMA (Ecstasy), urine           Cutoff 500 ng/mL 400  Cocaine Metabolite, urine       Cutoff 300 ng/mL 500  Opiate, urine                   Cutoff 300 ng/mL 600  Phencyclidine (PCP), urine      Cutoff 25 ng/mL 700  Cannabinoid, urine              Cutoff 50 ng/mL 800  Barbiturates, urine             Cutoff 200 ng/mL 900  Benzodiazepine, urine  Cutoff 200 ng/mL 1000 Methadone, urine                Cutoff 300 ng/mL 1100 1200 The urine drug screen provides only a preliminary, unconfirmed 1300 analytical test result and should not be used for non-medical 1400 purposes. Clinical consideration and professional judgment should 1500 be applied to any positive drug screen result due to possible 1600 interfering substances. A more specific alternate chemical method 1700 must be used in order to obtain a confirmed analytical result.  1800 Gas chromato graphy / mass spectrometry (GC/MS) is the preferred 1900 confirmatory method.     Current Facility-Administered Medications  Medication Dose Route Frequency Provider Last Rate Last Dose  . acetaminophen (TYLENOL) tablet 650 mg  650 mg Oral Q6H PRN Niel Hummer, NP   650 mg at 09/10/15 1538  . alum & mag hydroxide-simeth (MAALOX/MYLANTA) 200-200-20 MG/5ML suspension 30 mL  30 mL Oral Q4H PRN Niel Hummer, NP      . hydrOXYzine (ATARAX/VISTARIL) tablet 25 mg  25 mg Oral Q6H PRN Niel Hummer, NP   25 mg at 09/10/15 1538  . loperamide (IMODIUM) capsule 2-4 mg  2-4 mg Oral PRN Niel Hummer, NP      . LORazepam (ATIVAN) tablet 1 mg  1 mg Oral Q6H PRN Niel Hummer, NP      . LORazepam (ATIVAN) tablet 1 mg  1 mg Oral QID Niel Hummer, NP       Followed by  . [START ON 09/12/2015]  LORazepam (ATIVAN) tablet 1 mg  1 mg Oral TID Niel Hummer, NP       Followed by  . [START ON 09/13/2015] LORazepam (ATIVAN) tablet 1 mg  1 mg Oral BID Niel Hummer, NP       Followed by  . [START ON 09/14/2015] LORazepam (ATIVAN) tablet 1 mg  1 mg Oral Daily Niel Hummer, NP      . magnesium hydroxide (MILK OF MAGNESIA) suspension 30 mL  30 mL Oral Daily PRN Niel Hummer, NP      . multivitamin with minerals tablet 1 tablet  1 tablet Oral Daily Niel Hummer, NP   1 tablet at 09/10/15 1625  . ondansetron (ZOFRAN-ODT) disintegrating tablet 4 mg  4 mg Oral Q6H PRN Niel Hummer, NP   4 mg at 09/10/15 1537  . [START ON 09/11/2015] thiamine (VITAMIN B-1) tablet 100 mg  100 mg Oral Daily Niel Hummer, NP      . traZODone (DESYREL) tablet 50 mg  50 mg Oral QHS PRN Niel Hummer, NP        Musculoskeletal: Strength & Muscle Tone: within normal limits Gait & Station: normal Patient leans: N/A  Psychiatric Specialty Exam: Review of Systems  Constitutional: Positive for malaise/fatigue.  Neurological: Positive for tremors.  Psychiatric/Behavioral: Positive for depression and substance abuse. The patient is nervous/anxious.   All other systems reviewed and are negative.   Blood pressure 128/95, pulse 94, temperature 98.3 F (36.8 C), temperature source Oral, resp. rate 18, height _0  (1.778 m), weight 106.595 kg (235 lb), SpO2 96 %.Body mass index is 33.72 kg/(m^2).  General Appearance: Casual  Eye Contact::  Fair  Speech:  Clear and Coherent and Normal Rate  Volume:  Decreased  Mood:  Depressed  Affect:  Congruent  Thought Process:  Coherent and Logical  Orientation:  Full (Time, Place, and Person)  Thought Content:  WDL  Suicidal Thoughts:  No  Homicidal Thoughts:  No  Memory:  Immediate;   Fair  Judgement:  Fair  Insight:  Lacking  Psychomotor Activity:  Psychomotor Retardation  Concentration:  Fair  Recall:  [employer] of Knowledge:Fair  Language: Fair  Akathisia:  No   Handed:  Right  AIMS (if indicated):     Assets:  Communication Skills Desire for Improvement Social Support  ADL's:  Intact  Cognition: WNL  Sleep:      Treatment Plan Summary: Medication management  Daily contact with patient   Disposition: Recommend B[facility]-Observation admission  -Initiate Ativan taper for alcohol detox -Patient requests to be started on his Buspar, Neurontin, and Celexa. Will start at lower doses than reported due to reported history of non-compliance.   Elmarie Shiley, NP  09/10/2015 4:49 PM

## 2015-09-10 NOTE — ED Provider Notes (Addendum)
Patient will be transferred to [facility] for [facility]. Patient remains medically stable  Emily FilbertJonathan E Glenroy Crossen, MD 09/10/15 1123  Emily FilbertJonathan E Takiah Maiden, MD 09/10/15 [phone]

## 2015-09-10 NOTE — Plan of Care (Addendum)
[facility] Observation Crisis Plan  Reason for Crisis Plan:  Crisis Stabilization and Substance Abuse   Plan of Care:  Referral for Substance Abuse  Family Support:      Current Living Environment:  Living Arrangements: Spouse/significant other  Insurance:   Hospital Account    Name Acct ID Class Status Primary Coverage   [name] [name], [name] [name] [id number] BEHAVIORAL HEALTH OBSERVATION Open [employer]        Guarantor Account (for Hospital Account [id number])    Name Relation to Pt Service Area Active? Acct Type   [name], [name] [name] Self [facility] Yes Behavioral Health   Address Phone       [address] [place], [place] [address] [phone](H)          Coverage Information (for Hospital Account [id number])    F/O Payor/Plan Precert #   [employer] MEDICA[employer]    Subscriber Subscriber #   [name] [name], [name] [name] [id number] [facility]   Address Phone   [address] [place], [place] [address] [phone]      Legal Guardian:     Primary Care Provider:  [facility]  Current Outpatient Providers:  [facility] Psychiatrist:     Counselor/Therapist:     Compliant with Medications:  Yes  Additional Information:   [name] RacerMaggio, [name] [name] 11/21/20163:24 PM

## 2015-09-10 NOTE — ED Notes (Signed)
BEHAVIORAL HEALTH ROUNDING  Patient sleeping: No.  Patient alert and oriented: yes  Behavior appropriate: Yes. ; If no, describe:  Nutrition and fluids offered: Yes  Toileting and hygiene offered: Yes  Sitter present: not applicable  Law enforcement present: Yes ODS  ENVIRONMENTAL ASSESSMENT  Potentially harmful objects out of patient reach: Yes.  Personal belongings secured: Yes.  Patient dressed in hospital provided attire only: Yes.  Plastic bags out of patient reach: Yes.  Patient care equipment (cords, cables, call bells, lines, and drains) shortened, removed, or accounted for: Yes.  Equipment and supplies removed from bottom of stretcher: Yes.  Potentially toxic materials out of patient reach: Yes.  Sharps container removed or out of patient reach: Yes.   

## 2015-09-10 NOTE — H&P (Signed)
Expand All Collapse All   [facility] Unit H & P  Patient Identification: [name] [name] MRN: [id number] Principal Diagnosis: Alcohol use disorder severe  Alcohol-induced mood disorder  Diagnosis: Alcohol use disorder severe  Alcohol-induced mood disorder  Total Time spent with patient: 45 minutes  Subjective:  [name] [name] is a 44 y.o. male patient admitted with alcohol use after he was recently released from the jail on last Monday.  HPI: [name] [name] is a 44 year old male who was recently released from the jail last Monday and he relapsed on beer. He reported that he has problems with drinking and wants detox and get off alcohol. He reported that he has been drinking all day long. Most of the history was obtained from the patient as well as review of his records. Patient stated that he has been diagnosed with cirrhosis of the liver approximately one year ago while he was being detoxed at [facility]. Patient stated that he was prescribed BuSpar Celexa and Neurontin at the [facility] but he remains noncompliant with his medications. He stated that he was incarcerated for 8 days due to DWI Patient stated that he does not have a place to go at this time. He has history of shakes and blackouts in the past. He denied any history of seizures. He stated that he feels depressed hopeless at this time but denied having any suicidal ideations or plans. He is interested in going voluntarily to the rehabilitation program. Patient stated that he has history of multiple rehabilitation programs in the past as well. He stated that he has history of impulsive behavior as well. He currently denied having any perceptual disturbances.  Past Psychiatric History:   Patient denied any history of seizures but has history of delirium tremens in the past. He reported one history of suicide attempt. He was treated with antianxiety  and depression medications in the past but his major problem remains alcohol use. Patient has history of admissions for detoxification and has attended [facility], [facility] as well as [facility]. He reported that he was for almost 80 days at the [facility] in the past.  Risk to Self: Is patient at risk for suicide?: No Risk to Others:   Prior Inpatient Therapy:   Prior Outpatient Therapy:    Past Medical History:  Past Medical History  Diagnosis Date  . Cirrhosis of liver not due to alcohol ([place])   . Depression   . Anxiety   . Bipolar affect, depressed ([place])     Past Surgical History  Procedure Laterality Date  . Hernia repair     Family History: History reviewed. No pertinent family history. Family Psychiatric History:  Positive for substance abuse.  Social History:  History  Alcohol Use  . Yes    Comment: case and 1/2 daily of beer    History  Drug Use No    Social History   Social History  . Marital Status: Single    Spouse Name: N/A  . Number of Children: N/A  . Years of Education: N/A   Social History Main Topics  . Smoking status: Current Every Day Smoker  . Smokeless tobacco: None  . Alcohol Use: Yes     Comment: case and 1/2 daily of beer  . Drug Use: No  . Sexual Activity: Not Asked   Other Topics Concern  . None   Social History Narrative   Additional Social History:   History of alcohol / drug use?: Yes Longest period of sobriety (  when/how long): 80 days Negative Consequences of Use: Financial, Legal, Personal relationships, Work / School Withdrawal Symptoms: Nausea / Vomiting, Agitation Name of Substance 1: ETOH 1 - Age of First Use: 17 1 - Amount (size/oz): 1 1/2 cases 1 - Frequency: daily 1 - Duration: all day 1 - Last Use / Amount: 09/09/2015       patient reported that he was married one time in the past. He stated that he has 4 children ages  18,26, 36 and 80     all the children have different mothers.          Allergies:  Allergies  Allergen Reactions  . Coconut Flavor Hives    Labs:   Lab Results Last 48 Hours    Results for orders placed or performed during the hospital encounter of 09/09/15 (from the past 48 hour(s))  Comprehensive metabolic panel Status: Abnormal   Collection Time: 09/09/15 9:05 PM  Result Value Ref Range   Sodium 138 135 - 145 mmol/L   Potassium 3.4 (L) 3.5 - 5.1 mmol/L   Chloride 103 101 - 111 mmol/L   CO2 26 22 - 32 mmol/L   Glucose, Bld 125 (H) 65 - 99 mg/dL   BUN 10 6 - 20 mg/dL   Creatinine, Ser 0.81 0.61 - 1.24 mg/dL   Calcium 8.5 (L) 8.9 - 10.3 mg/dL   Total Protein 7.4 6.5 - 8.1 g/dL   Albumin 4.3 3.5 - 5.0 g/dL   AST 76 (H) 15 - 41 U/L   ALT 50 17 - 63 U/L   Alkaline Phosphatase 64 38 - 126 U/L   Total Bilirubin 1.2 0.3 - 1.2 mg/dL   GFR calc non Af Amer >60 >60 mL/min   GFR calc Af Amer >60 >60 mL/min    Comment: (NOTE) The eGFR has been calculated using the CKD EPI equation. This calculation has not been validated in all clinical situations. eGFR's persistently <60 mL/min signify possible Chronic Kidney Disease.    Anion gap 9 5 - 15  Ethanol Status: Abnormal   Collection Time: 09/09/15 9:05 PM  Result Value Ref Range   Alcohol, Ethyl (B) 289 (H) <5 mg/dL    Comment:   LOWEST DETECTABLE LIMIT FOR SERUM ALCOHOL IS 5 mg/dL FOR MEDICAL PURPOSES ONLY   Salicylate level Status: None   Collection Time: 09/09/15 9:05 PM  Result Value Ref Range   Salicylate Lvl <8.8 2.8 - 30.0 mg/dL  Acetaminophen level Status: Abnormal   Collection Time: 09/09/15 9:05 PM  Result Value Ref Range   Acetaminophen (Tylenol), Serum <10 (L) 10 - 30 ug/mL    Comment:   THERAPEUTIC CONCENTRATIONS VARY SIGNIFICANTLY. A RANGE OF  10-30 ug/mL MAY BE AN EFFECTIVE CONCENTRATION FOR MANY PATIENTS. HOWEVER, SOME ARE BEST TREATED AT CONCENTRATIONS OUTSIDE THIS RANGE. ACETAMINOPHEN CONCENTRATIONS >150 ug/mL AT 4 HOURS AFTER INGESTION AND >50 ug/mL AT 12 HOURS AFTER INGESTION ARE OFTEN ASSOCIATED WITH TOXIC REACTIONS.   CBC with Differential Status: Abnormal   Collection Time: 09/09/15 9:05 PM  Result Value Ref Range   WBC 12.7 (H) 3.8 - 10.6 K/uL   RBC 4.36 (L) 4.40 - 5.90 MIL/uL   Hemoglobin 13.8 13.0 - 18.0 g/dL   HCT 40.1 40.0 - 52.0 %   MCV 92.0 80.0 - 100.0 fL   MCH 31.7 26.0 - 34.0 pg   MCHC 34.4 32.0 - 36.0 g/dL   RDW 12.5 11.5 - 14.5 %   Platelets 200 150 - 440 K/uL   Neutrophils  Relative % 58 %   Neutro Abs 7.5 (H) 1.4 - 6.5 K/uL   Lymphocytes Relative 30 %   Lymphs Abs 3.8 (H) 1.0 - 3.6 K/uL   Monocytes Relative 8 %   Monocytes Absolute 1.0 0.2 - 1.0 K/uL   Eosinophils Relative 3 %   Eosinophils Absolute 0.3 0 - 0.7 K/uL   Basophils Relative 1 %   Basophils Absolute 0.1 0 - 0.1 K/uL  Urinalysis complete, with microscopic Status: Abnormal   Collection Time: 09/09/15 11:05 PM  Result Value Ref Range   Color, Urine YELLOW (A) YELLOW   APPearance CLEAR (A) CLEAR   Glucose, UA NEGATIVE NEGATIVE mg/dL   Bilirubin Urine NEGATIVE NEGATIVE   Ketones, ur NEGATIVE NEGATIVE mg/dL   Specific Gravity, Urine 1.008 1.005 - 1.030   Hgb urine dipstick NEGATIVE NEGATIVE   pH 5.0 5.0 - 8.0   Protein, ur NEGATIVE NEGATIVE mg/dL   Nitrite NEGATIVE NEGATIVE   Leukocytes, UA NEGATIVE NEGATIVE   RBC / HPF NONE SEEN 0 - 5 RBC/hpf   WBC, UA 0-5 0 - 5 WBC/hpf   Bacteria, UA NONE SEEN NONE SEEN   Squamous Epithelial / LPF NONE SEEN NONE SEEN   Mucous PRESENT   Urine Drug Screen, Qualitative Status: None   Collection Time: 09/09/15 11:05 PM   Result Value Ref Range   Tricyclic, Ur Screen NONE DETECTED NONE DETECTED   Amphetamines, Ur Screen NONE DETECTED NONE DETECTED   MDMA (Ecstasy)Ur Screen NONE DETECTED NONE DETECTED   Cocaine Metabolite,Ur [place] NONE DETECTED NONE DETECTED   Opiate, Ur Screen NONE DETECTED NONE DETECTED   Phencyclidine (PCP) Ur S NONE DETECTED NONE DETECTED   Cannabinoid 50 Ng, Ur  NONE DETECTED NONE DETECTED   Barbiturates, Ur Screen NONE DETECTED NONE DETECTED   Benzodiazepine, Ur Scrn NONE DETECTED NONE DETECTED   Methadone Scn, Ur NONE DETECTED NONE DETECTED    Comment: (NOTE) 381 Tricyclics, urine Cutoff 8299 ng/mL 200 Amphetamines, urine Cutoff 1000 ng/mL 300 MDMA (Ecstasy), urine Cutoff 500 ng/mL 400 Cocaine Metabolite, urine Cutoff 300 ng/mL 500 Opiate, urine Cutoff 300 ng/mL 600 Phencyclidine (PCP), urine Cutoff 25 ng/mL 700 Cannabinoid, urine Cutoff 50 ng/mL 800 Barbiturates, urine Cutoff 200 ng/mL 900 Benzodiazepine, urine Cutoff 200 ng/mL 1000 Methadone, urine Cutoff 300 ng/mL 1100 1200 The urine drug screen provides only a preliminary, unconfirmed 1300 analytical test result and should not be used for non-medical 1400 purposes. Clinical consideration and professional judgment should 1500 be applied to any positive drug screen result due to possible 1600 interfering substances. A more specific alternate chemical method 1700 must be used in order to obtain a confirmed analytical result.  1800 Gas chromato graphy / mass spectrometry (GC/MS) is the preferred 1900 confirmatory method.       Current Facility-Administered Medications  Medication Dose Route Frequency Provider Last Rate Last Dose  . acetaminophen (TYLENOL) tablet 650 mg 650 mg Oral Q6H PRN Niel Hummer, NP  650 mg at 09/10/15 1538   . alum & mag hydroxide-simeth (MAALOX/MYLANTA) 200-200-20 MG/5ML suspension 30 mL 30 mL Oral Q4H PRN Niel Hummer, NP    . hydrOXYzine (ATARAX/VISTARIL) tablet 25 mg 25 mg Oral Q6H PRN Niel Hummer, NP  25 mg at 09/10/15 1538  . loperamide (IMODIUM) capsule 2-4 mg 2-4 mg Oral PRN Niel Hummer, NP    . LORazepam (ATIVAN) tablet 1 mg 1 mg Oral Q6H PRN Niel Hummer, NP    . LORazepam (ATIVAN) tablet 1 mg 1  mg Oral QID Niel Hummer, NP     Followed by  . [START ON 09/12/2015] LORazepam (ATIVAN) tablet 1 mg 1 mg Oral TID Niel Hummer, NP     Followed by  . [START ON 09/13/2015] LORazepam (ATIVAN) tablet 1 mg 1 mg Oral BID Niel Hummer, NP     Followed by  . [START ON 09/14/2015] LORazepam (ATIVAN) tablet 1 mg 1 mg Oral Daily Niel Hummer, NP    . magnesium hydroxide (MILK OF MAGNESIA) suspension 30 mL 30 mL Oral Daily PRN Niel Hummer, NP    . multivitamin with minerals tablet 1 tablet 1 tablet Oral Daily Niel Hummer, NP  1 tablet at 09/10/15 1625  . ondansetron (ZOFRAN-ODT) disintegrating tablet 4 mg 4 mg Oral Q6H PRN Niel Hummer, NP  4 mg at 09/10/15 1537  . [START ON 09/11/2015] thiamine (VITAMIN B-1) tablet 100 mg 100 mg Oral Daily Niel Hummer, NP    . traZODone (DESYREL) tablet 50 mg 50 mg Oral QHS PRN Niel Hummer, NP      Musculoskeletal: Strength & Muscle Tone: within normal limits Gait & Station: normal Patient leans: N/A  Psychiatric Specialty Exam: Review of Systems  Constitutional: Positive for malaise/fatigue.  Neurological: Positive for tremors.  Psychiatric/Behavioral: Positive for depression and substance abuse. The patient is nervous/anxious.  All other systems reviewed and are negative.   Blood pressure 128/95, pulse 94, temperature 98.3 F (36.8 C), temperature source Oral, resp. rate 18, height _0  (1.778  m), weight 106.595 kg (235 lb), SpO2 96 %.Body mass index is 33.72 kg/(m^2).  General Appearance: Casual  Eye Contact:: Fair  Speech: Clear and Coherent and Normal Rate  Volume: Decreased  Mood: Depressed  Affect: Congruent  Thought Process: Coherent and Logical  Orientation: Full (Time, Place, and Person)  Thought Content: WDL  Suicidal Thoughts: No  Homicidal Thoughts: No  Memory: Immediate; Fair  Judgement: Fair  Insight: Lacking  Psychomotor Activity: Psychomotor Retardation  Concentration: Fair  Recall: [employer] of Knowledge:Fair  Language: Fair  Akathisia: No  Handed: Right  AIMS (if indicated):    Assets: Communication Skills Desire for Improvement Social Support  ADL's: Intact  Cognition: WNL  Sleep:     Treatment Plan Summary: Medication management  Daily contact with patient   Disposition: Recommend [facility]-Observation admission  -Initiate Ativan taper for alcohol detox -Patient requests to be started on his Buspar, Neurontin, and Celexa. Will start at lower doses than reported due to reported history of non-compliance.   Elmarie Shiley, NP  09/10/2015 4:49 PM             I agree with assessment and plan Geralyn Flash A. Sabra Heck, M.D.

## 2015-09-10 NOTE — ED Notes (Signed)
BEHAVIORAL HEALTH ROUNDING Patient sleeping: Yes.   Patient alert and oriented: yes Behavior appropriate: Yes.  ; If no, describe:  Nutrition and fluids offered: Yes  Toileting and hygiene offered: Yes  Sitter present: yes Law enforcement present: Yes  

## 2015-09-10 NOTE — Discharge Instructions (Signed)

## 2015-09-11 DIAGNOSIS — F1024 Alcohol dependence with alcohol-induced mood disorder: Secondary | ICD-10-CM | POA: Diagnosis not present

## 2015-09-11 MED ORDER — THIAMINE HCL 100 MG PO TABS: 100.0000 mg | ORAL_TABLET | Freq: Every day | ORAL | Status: DC

## 2015-09-11 MED ORDER — CITALOPRAM HYDROBROMIDE 20 MG PO TABS: 20.0000 mg | ORAL_TABLET | Freq: Every day | ORAL | Status: DC

## 2015-09-11 MED ORDER — ADULT MULTIVITAMIN W/MINERALS CH: 1.0000 | ORAL_TABLET | Freq: Every day | ORAL | Status: DC

## 2015-09-11 MED ORDER — GABAPENTIN 100 MG PO CAPS: 200.0000 mg | ORAL_CAPSULE | Freq: Three times a day (TID) | ORAL | Status: DC

## 2015-09-11 MED ORDER — BUSPIRONE HCL 5 MG PO TABS: 5.0000 mg | ORAL_TABLET | Freq: Three times a day (TID) | ORAL | Status: DC

## 2015-09-11 MED ORDER — TRAZODONE HCL 50 MG PO TABS: 50.0000 mg | ORAL_TABLET | Freq: Every evening | ORAL | Status: DC | PRN
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
## 2015-09-11 NOTE — Progress Notes (Signed)
D: Pt was asleep during most of the shift. Writer decided to wake pt in order to assess. Pt was also given his scheduled ativan. Pt denied having any questions or concerns at that time. Was given snacks and drink.   A:  Support and encouragement was offered. 15 min checks continued for safety.  R: Pt remains safe.

## 2015-09-11 NOTE — Progress Notes (Signed)
Patient ID: [name] BurrowRobert J [name], male   DOB: 01/09/1971, 44 y.o.   MRN: [id number] Patient discharged per MD orders. Patient given education regarding follow-up appointments and medications. Patient denies any questions or concerns about these instructions. Patient was escorted to locker and given belongings before discharge to hospital lobby. Patient currently denies SI/HI and auditory and visual hallucinations on discharge.

## 2015-10-09 ENCOUNTER — Encounter: Payer: Self-pay | Admitting: Emergency Medicine

## 2015-10-09 ENCOUNTER — Emergency Department
Admission: EM | Admit: 2015-10-09 | Discharge: 2015-10-09 | Disposition: A | Discharge status: Home / Self Care | Payer: Medicaid Other | Attending: Emergency Medicine | Admitting: Emergency Medicine

## 2015-10-09 DIAGNOSIS — Z79899 Other long term (current) drug therapy: Secondary | ICD-10-CM | POA: Diagnosis not present

## 2015-10-09 DIAGNOSIS — F17200 Nicotine dependence, unspecified, uncomplicated: Secondary | ICD-10-CM | POA: Diagnosis not present

## 2015-10-09 DIAGNOSIS — Z760 Encounter for issue of repeat prescription: Secondary | ICD-10-CM | POA: Diagnosis not present

## 2015-10-09 DIAGNOSIS — F419 Anxiety disorder, unspecified: Secondary | ICD-10-CM | POA: Diagnosis not present

## 2015-10-09 MED ORDER — LORAZEPAM 1 MG PO TABS
1.0000 mg | ORAL_TABLET | Freq: Once | ORAL | Status: AC
  Administered 2015-10-09: 1 mg via ORAL
  Filled 2015-10-09: qty 1

## 2015-10-09 MED ORDER — GABAPENTIN 100 MG PO CAPS: 200.0000 mg | ORAL_CAPSULE | Freq: Three times a day (TID) | ORAL | Status: DC

## 2015-10-09 MED ORDER — BUSPIRONE HCL 5 MG PO TABS: 5.0000 mg | ORAL_TABLET | Freq: Three times a day (TID) | ORAL | Status: DC

## 2015-10-09 MED ORDER — CITALOPRAM HYDROBROMIDE 20 MG PO TABS: 20.0000 mg | ORAL_TABLET | Freq: Every day | ORAL | Status: DC

## 2015-10-09 NOTE — ED Provider Notes (Signed)
[facility] Emergency Department Provider Note  ____________________________________________  Time seen: Approximately 10:36 AM  I have reviewed the triage vital signs and the nursing notes.   HISTORY  Chief Complaint Medication Refill    HPI [name] [name] is a 44 y.o. male who presents to the emergency department for a refill of medication. Patient was seen at [facility] for behavioral observation for detox and anxiety. Patient was placed on BuSpar, gabapentin, and Celexa. He is given enough for 1 week and medications. Patient states that he does not currently have a primary care and is in the process of establishing care. However, he is unable to follow up until the first part of the month. He states that he ran out of medications yesterday and has begun to fill overly anxious. Patient is requesting refill of his medications until he can see his new primary care the start of the next month. Patient denies any suicidal or homicidal ideations at this time.   Past Medical History  Diagnosis Date  . Cirrhosis of liver not due to alcohol (HCC)   . Depression   . Anxiety   . Bipolar affect, depressed [facility](HCC)     Patient Active Problem List   Diagnosis Date Noted  . Alcohol use disorder, severe, dependence (HCC) 09/10/2015  . Alcohol intoxication [facility](HCC)     Past Surgical History  Procedure Laterality Date  . Hernia repair      Current Outpatient Rx  Name  Route  Sig  Dispense  Refill  . busPIRone (BUSPAR) 5 MG tablet   Oral   Take 1 tablet (5 mg total) by mouth 3 (three) times daily.   90 tablet   0   . citalopram (CELEXA) 20 MG tablet   Oral   Take 1 tablet (20 mg total) by mouth daily.   30 tablet   0   . gabapentin (NEURONTIN) 100 MG capsule   Oral   Take 2 capsules (200 mg total) by mouth 3 (three) times daily.   180 capsule   0   . Multiple Vitamin (MULTIVITAMIN WITH MINERALS) TABS tablet   Oral   Take 1 tablet by mouth daily.         Marland Kitchen. thiamine 100 MG tablet   Oral   Take 1 tablet (100 mg total) by mouth daily.         . traZODone (DESYREL) 50 MG tablet   Oral   Take 1 tablet (50 mg total) by mouth at bedtime as needed for sleep.   14 tablet   0     Allergies Coconut flavor  No family history on file.  Social History Social History  Substance Use Topics  . Smoking status: Current Every Day Smoker  . Smokeless tobacco: None  . Alcohol Use: Yes     Comment: case and 1/2 daily of beer    Review of Systems Constitutional: No fever/chills Eyes: No visual changes. ENT: No sore throat. Cardiovascular: Denies chest pain. Respiratory: Denies shortness of breath. Gastrointestinal: No abdominal pain.  No nausea, no vomiting.  No diarrhea.  No constipation. Genitourinary: Negative for dysuria. Musculoskeletal: Negative for back pain. Skin: Negative for rash. Neurological: Negative for headaches, focal weakness or numbness. Psychiatric:Patient is endorsing increasing anxiety. He denies suicidal or homicidal ideations.  10-point ROS otherwise negative.  ____________________________________________   PHYSICAL EXAM:  VITAL SIGNS: ED Triage Vitals  Enc Vitals Group     BP 10/09/15 1018 120/86 mmHg  Pulse Rate 10/09/15 1018 102     Resp 10/09/15 1018 18     Temp 10/09/15 1018 97.9 F (36.6 C)     Temp Source 10/09/15 1018 Oral     SpO2 10/09/15 1018 96 %     Weight 10/09/15 1018 225 lb (102.059 kg)     Height 10/09/15 1018  (1.778 m)     Head Cir --      Peak Flow --      Pain Score 10/09/15 1021 0     Pain Loc --      Pain Edu? --      Excl. in GC? --     Constitutional: Alert and oriented. Well appearing and in no acute distress. Eyes: Conjunctivae are normal. PERRL. EOMI. Head: Atraumatic. Nose: No congestion/rhinnorhea. Mouth/Throat: Mucous membranes are moist.  Oropharynx non-erythematous. Neck: No stridor.   Cardiovascular: Normal rate, regular rhythm. Grossly normal  heart sounds.  Good peripheral circulation. Respiratory: Normal respiratory effort.  No retractions. Lungs CTAB. Gastrointestinal: Soft and nontender. No distention. No abdominal bruits. No CVA tenderness. Musculoskeletal: No lower extremity tenderness nor edema.  No joint effusions. Neurologic:  Normal speech and language. No gross focal neurologic deficits are appreciated. No gait instability. Skin:  Skin is warm, dry and intact. No rash noted. Psychiatric: Mood and affect are normal. Speech and behavior are normal.  ____________________________________________   LABS (all labs ordered are listed, but only abnormal results are displayed)  Labs Reviewed - No data to display ____________________________________________  EKG   ____________________________________________  RADIOLOGY   ____________________________________________   PROCEDURES  Procedure(s) performed: None  Critical Care performed: No  ____________________________________________   INITIAL IMPRESSION / ASSESSMENT AND PLAN / ED COURSE  Pertinent labs & imaging results that were available during my care of the patient were reviewed by me and considered in my medical decision making (see chart for details).  Patient presents emergency Department looking for a refill of his medications. Patient was discharged from [facility] after behavioral observation following alcohol detox and panic attacks. Patient was given 1 weeks of medication and states that he is trying to establish care with primary care for further follow-up. He is unable to obtain a visit until the start of the next month. Patient is requesting refill of his medications. Patient has been taking his medications as prescribed and last ran out last night. He is now endorsing some anxiety symptoms. Patient denies suicidal or homicidal ideations at this time. Patient will be given 1 mg of Ativan for anxiety here and then discharged home with refills of his  prescribed medications. Patient will be given one month's supply of BuSpar, Celexa, and gabapentin. Patient will follow-up with primary care for further refills and management.    New Prescriptions   BUSPIRONE (BUSPAR) 5 MG TABLET    Take 1 tablet (5 mg total) by mouth 3 (three) times daily.   CITALOPRAM (CELEXA) 20 MG TABLET    Take 1 tablet (20 mg total) by mouth daily.   GABAPENTIN (NEURONTIN) 100 MG CAPSULE    Take 2 capsules (200 mg total) by mouth 3 (three) times daily.    ____________________________________________   FINAL CLINICAL IMPRESSION(S) / ED DIAGNOSES  Final diagnoses:  Anxiety disorder, unspecified anxiety disorder type  Encounter for medication refill      Racheal Patches, PA-C 10/09/15 1045  Governor Rooks, MD 10/09/15 [phone]

## 2015-10-09 NOTE — ED Notes (Signed)
Pt states he was just released from  [facility] last week and was only given enough medication to last a week, states he is know out of his medication and feels like his anxiety in increased, out of Celexa, gabapentin, and buspar

## 2015-10-09 NOTE — Discharge Instructions (Signed)
Generalized Anxiety Disorder °Generalized anxiety disorder (GAD) is a mental disorder. It interferes with life functions, including relationships, work, and school. °GAD is different from normal anxiety, which everyone experiences at some point in their lives in response to specific life events and activities. Normal anxiety actually helps us prepare for and get through these life events and activities. Normal anxiety goes away after the event or activity is over.  °GAD causes anxiety that is not necessarily related to specific events or activities. It also causes excess anxiety in proportion to specific events or activities. The anxiety associated with GAD is also difficult to control. GAD can vary from mild to severe. People with severe GAD can have intense waves of anxiety with physical symptoms (panic attacks).  °SYMPTOMS °The anxiety and worry associated with GAD are difficult to control. This anxiety and worry are related to many life events and activities and also occur more days than not for 6 months or longer. People with GAD also have three or more of the following symptoms (one or more in children): °· Restlessness.   °· Fatigue. °· Difficulty concentrating.   °· Irritability. °· Muscle tension. °· Difficulty sleeping or unsatisfying sleep. °DIAGNOSIS °GAD is diagnosed through an assessment by your health care provider. Your health care provider will ask you questions about your mood, physical symptoms, and events in your life. Your health care provider may ask you about your medical history and use of alcohol or drugs, including prescription medicines. Your health care provider may also do a physical exam and blood tests. Certain medical conditions and the use of certain substances can cause symptoms similar to those associated with GAD. Your health care provider may refer you to a mental health specialist for further evaluation. °TREATMENT °The following therapies are usually used to treat GAD:   °· Medication. Antidepressant medication usually is prescribed for long-term daily control. Antianxiety medicines may be added in severe cases, especially when panic attacks occur.   °· Talk therapy (psychotherapy). Certain types of talk therapy can be helpful in treating GAD by providing support, education, and guidance. A form of talk therapy called cognitive behavioral therapy can teach you healthy ways to think about and react to daily life events and activities. °· Stress management techniques. These include yoga, meditation, and exercise and can be very helpful when they are practiced regularly. °A mental health specialist can help determine which treatment is best for you. Some people see improvement with one therapy. However, other people require a combination of therapies. °  °This information is not intended to replace advice given to you by your health care provider. Make sure you discuss any questions you have with your health care provider. °  °Document Released: 01/31/2013 Document Revised: 10/27/2014 Document Reviewed: 01/31/2013 °Elsevier Interactive Patient Education ©2016 Else[employer] Inc. ° °Panic Attacks °Panic attacks are sudden, short-lived surges of severe anxiety, fear, or discomfort. They may occur for no reason when you are relaxed, when you are anxious, or when you are sleeping. Panic attacks may occur for a number of reasons:  °· Healthy people occasionally have panic attacks in extreme, life-threatening situations, such as war or natural disasters. Normal anxiety is a protective mechanism of the body that helps us react to danger (fight or flight response). °· Panic attacks are often seen with anxiety disorders, such as panic disorder, social anxiety disorder, generalized anxiety disorder, and phobias. Anxiety disorders cause excessive or uncontrollable anxiety. They may interfere with your relationships or other life activities. °· Panic attacks are sometimes seen with other   mental  illnesses, such as depression and posttraumatic stress disorder. °· Certain medical conditions, prescription medicines, and drugs of abuse can cause panic attacks. °SYMPTOMS  °Panic attacks start suddenly, peak within 20 minutes, and are accompanied by four or more of the following symptoms: °· Pounding heart or fast heart rate (palpitations). °· Sweating. °· Trembling or shaking. °· Shortness of breath or feeling smothered. °· Feeling choked. °· Chest pain or discomfort. °· Nausea or strange feeling in your stomach. °· Dizziness, light-headedness, or feeling like you will faint. °· Chills or hot flushes. °· Numbness or tingling in your lips or hands and feet. °· Feeling that things are not real or feeling that you are not yourself. °· Fear of losing control or going crazy. °· Fear of dying. °Some of these symptoms can mimic serious medical conditions. For example, you may think you are having a heart attack. Although panic attacks can be very scary, they are not life threatening. °DIAGNOSIS  °Panic attacks are diagnosed through an assessment by your health care provider. Your health care provider will ask questions about your symptoms, such as where and when they occurred. Your health care provider will also ask about your medical history and use of alcohol and drugs, including prescription medicines. Your health care provider may order blood tests or other studies to rule out a serious medical condition. Your health care provider may refer you to a mental health professional for further evaluation. °TREATMENT  °· Most healthy people who have one or two panic attacks in an extreme, life-threatening situation will not require treatment. °· The treatment for panic attacks associated with anxiety disorders or other mental illness typically involves counseling with a mental health professional, medicine, or a combination of both. Your health care provider will help determine what treatment is best for you. °· Panic  attacks due to physical illness usually go away with treatment of the illness. If prescription medicine is causing panic attacks, talk with your health care provider about stopping the medicine, decreasing the dose, or substituting another medicine. °· Panic attacks due to alcohol or drug abuse go away with abstinence. Some adults need professional help in order to stop drinking or using drugs. °HOME CARE INSTRUCTIONS  °· Take all medicines as directed by your health care provider.   °· Schedule and attend follow-up visits as directed by your health care provider. It is important to keep all your appointments. °SEEK MEDICAL CARE IF: °· You are not able to take your medicines as prescribed. °· Your symptoms do not improve or get worse. °SEEK IMMEDIATE MEDICAL CARE IF:  °· You experience panic attack symptoms that are different than your usual symptoms. °· You have serious thoughts about hurting yourself or others. °· You are taking medicine for panic attacks and have a serious side effect. °MAKE SURE YOU: °· Understand these instructions. °· Will watch your condition. °· Will get help right away if you are not doing well or get worse. °  °This information is not intended to replace advice given to you by your health care provider. Make sure you discuss any questions you have with your health care provider. °  °Document Released: 10/06/2005 Document Revised: 10/11/2013 Document Reviewed: 05/20/2013 °Elsevier Interactive Patient Education ©2016 Elsevier Inc. ° °

## 2016-03-31 ENCOUNTER — Encounter: Payer: Self-pay | Admitting: Emergency Medicine

## 2016-03-31 ENCOUNTER — Emergency Department
Admission: EM | Admit: 2016-03-31 | Discharge: 2016-04-01 | Disposition: A | Discharge status: Other Acute Inpatient Hospital | Payer: Medicaid Other | Attending: Emergency Medicine | Admitting: Emergency Medicine

## 2016-03-31 DIAGNOSIS — F1910 Other psychoactive substance abuse, uncomplicated: Secondary | ICD-10-CM

## 2016-03-31 DIAGNOSIS — F1994 Other psychoactive substance use, unspecified with psychoactive substance-induced mood disorder: Secondary | ICD-10-CM | POA: Diagnosis not present

## 2016-03-31 DIAGNOSIS — F1020 Alcohol dependence, uncomplicated: Secondary | ICD-10-CM | POA: Diagnosis not present

## 2016-03-31 DIAGNOSIS — F10929 Alcohol use, unspecified with intoxication, unspecified: Secondary | ICD-10-CM | POA: Diagnosis present

## 2016-03-31 DIAGNOSIS — F17200 Nicotine dependence, unspecified, uncomplicated: Secondary | ICD-10-CM | POA: Diagnosis not present

## 2016-03-31 DIAGNOSIS — F319 Bipolar disorder, unspecified: Secondary | ICD-10-CM | POA: Insufficient documentation

## 2016-03-31 DIAGNOSIS — F489 Nonpsychotic mental disorder, unspecified: Secondary | ICD-10-CM | POA: Diagnosis present

## 2016-03-31 DIAGNOSIS — R45851 Suicidal ideations: Secondary | ICD-10-CM | POA: Diagnosis not present

## 2016-03-31 DIAGNOSIS — F3130 Bipolar disorder, current episode depressed, mild or moderate severity, unspecified: Secondary | ICD-10-CM | POA: Insufficient documentation

## 2016-03-31 DIAGNOSIS — Z79899 Other long term (current) drug therapy: Secondary | ICD-10-CM | POA: Diagnosis not present

## 2016-03-31 LAB — URINE DRUG SCREEN, QUALITATIVE (ARMC ONLY)
Amphetamines, Ur Screen: NOT DETECTED
Barbiturates, Ur Screen: NOT DETECTED
Benzodiazepine, Ur Scrn: NOT DETECTED
CANNABINOID 50 NG, UR ~~LOC~~: NOT DETECTED
Cocaine Metabolite,Ur ~~LOC~~: POSITIVE — AB
MDMA (ECSTASY) UR SCREEN: NOT DETECTED
Methadone Scn, Ur: NOT DETECTED
OPIATE, UR SCREEN: NOT DETECTED
PHENCYCLIDINE (PCP) UR S: NOT DETECTED
Tricyclic, Ur Screen: NOT DETECTED

## 2016-03-31 LAB — COMPREHENSIVE METABOLIC PANEL
ALT: 117 U/L — AB (ref 17–63)
AST: 117 U/L — AB (ref 15–41)
Albumin: 4.5 g/dL (ref 3.5–5.0)
Alkaline Phosphatase: 78 U/L (ref 38–126)
Anion gap: 12 (ref 5–15)
BILIRUBIN TOTAL: 0.9 mg/dL (ref 0.3–1.2)
BUN: 8 mg/dL (ref 6–20)
CHLORIDE: 104 mmol/L (ref 101–111)
CO2: 22 mmol/L (ref 22–32)
CREATININE: 0.82 mg/dL (ref 0.61–1.24)
Calcium: 9 mg/dL (ref 8.9–10.3)
GFR calc Af Amer: >60 mL/min (ref >60)
GLUCOSE: 125 mg/dL — AB (ref 65–99)
Potassium: 3.5 mmol/L (ref 3.5–5.1)
Sodium: 138 mmol/L (ref 135–145)
TOTAL PROTEIN: 8 g/dL (ref 6.5–8.1)

## 2016-03-31 LAB — CBC
HEMATOCRIT: 43.2 % (ref 40.0–52.0)
Hemoglobin: 15.3 g/dL (ref 13.0–18.0)
MCH: 33.2 pg (ref 26.0–34.0)
MCHC: 35.4 g/dL (ref 32.0–36.0)
MCV: 94 fL (ref 80.0–100.0)
PLATELETS: 320 10*3/uL (ref 150–440)
RBC: 4.6 MIL/uL (ref 4.40–5.90)
RDW: 12.8 % (ref 11.5–14.5)
WBC: 14.2 10*3/uL — ABNORMAL HIGH (ref 3.8–10.6)

## 2016-03-31 LAB — SALICYLATE LEVEL: Salicylate Lvl: <4 mg/dL (ref 2.8–30.0)

## 2016-03-31 LAB — ACETAMINOPHEN LEVEL: Acetaminophen (Tylenol), Serum: <10 ug/mL — ABNORMAL LOW (ref 10–30)

## 2016-03-31 LAB — ETHANOL: ALCOHOL ETHYL (B): 314 mg/dL — AB (ref <5)

## 2016-03-31 MED ORDER — GABAPENTIN 600 MG PO TABS
300.0000 mg | ORAL_TABLET | Freq: Every day | ORAL | Status: DC
  Administered 2016-03-31: 300 mg via ORAL
  Filled 2016-03-31: qty 1

## 2016-03-31 MED ORDER — VITAMIN B-1 100 MG PO TABS
100.0000 mg | ORAL_TABLET | Freq: Every day | ORAL | Status: DC
  Administered 2016-04-01 (×2): 100 mg via ORAL
  Filled 2016-03-31: qty 1

## 2016-03-31 MED ORDER — LORAZEPAM 2 MG PO TABS: 0.0000 mg | ORAL_TABLET | Freq: Two times a day (BID) | ORAL | Status: DC

## 2016-03-31 MED ORDER — THIAMINE HCL 100 MG/ML IJ SOLN: 100.0000 mg | Freq: Every day | INTRAMUSCULAR | Status: DC

## 2016-03-31 MED ORDER — LORAZEPAM 2 MG PO TABS
0.0000 mg | ORAL_TABLET | Freq: Four times a day (QID) | ORAL | Status: DC
  Administered 2016-04-01: 1 mg via ORAL
  Administered 2016-04-01: 2 mg via ORAL
  Filled 2016-03-31: qty 1

## 2016-03-31 NOTE — ED Notes (Signed)
C/O Suicidal thoughts onset of symptoms yesterday.  Patient appears intoxicated at this time.  Patient states "I need help".  [facility].

## 2016-03-31 NOTE — ED Notes (Signed)
EDP notified of ETOH levels.  EDP has no further orders at this time.

## 2016-03-31 NOTE — ED Provider Notes (Signed)
[facility] Emergency Department Provider Note   ____________________________________________  Time seen: Approximately 945 PM  I have reviewed the triage vital signs and the nursing notes.   HISTORY  Chief Complaint Mental Health Problem   HPI [name] [name] is a 45 y.o. male with a history of depression, bipolar as well as cirrhosis was presented to the emergency department today with suicidal thoughts. He says that the thoughts came out yesterday as result of problems with his wife. Says that he wants to also go [facility] detox. He admits to drinking 30-40 beers per day with his last drink being 4 hours prior to arrival. Denying any pain at this time. Also admits to smoking cocaine as well as marijuana recreationally.   Past Medical History  Diagnosis Date  . Cirrhosis of liver not due to alcohol (HCC)   . Depression   . Anxiety   . Bipolar affect, depressed [facility])     Patient Active Problem List   Diagnosis Date Noted  . Alcohol use disorder, severe, dependence (HCC) 09/10/2015  . Alcohol intoxication [facility])     Past Surgical History  Procedure Laterality Date  . Hernia repair      Current Outpatient Rx  Name  Route  Sig  Dispense  Refill  . busPIRone (BUSPAR) 5 MG tablet   Oral   Take 1 tablet (5 mg total) by mouth 3 (three) times daily.   90 tablet   0   . citalopram (CELEXA) 20 MG tablet   Oral   Take 1 tablet (20 mg total) by mouth daily.   30 tablet   0   . gabapentin (NEURONTIN) 100 MG capsule   Oral   Take 2 capsules (200 mg total) by mouth 3 (three) times daily.   180 capsule   0   . Multiple Vitamin (MULTIVITAMIN WITH MINERALS) TABS tablet   Oral   Take 1 tablet by mouth daily.         Marland Kitchen thiamine 100 MG tablet   Oral   Take 1 tablet (100 mg total) by mouth daily.         . traZODone (DESYREL) 50 MG tablet   Oral   Take 1 tablet (50 mg total) by mouth at bedtime as needed for sleep.   14  tablet   0     Allergies Coconut flavor  No family history on file.  Social History Social History  Substance Use Topics  . Smoking status: Current Every Day Smoker  . Smokeless tobacco: None  . Alcohol Use: Yes     Comment: case and 1/2 daily of beer    Review of Systems Constitutional: No fever/chills Eyes: No visual changes. ENT: No sore throat. Cardiovascular: Denies chest pain. Respiratory: Denies shortness of breath. Gastrointestinal: No abdominal pain.  No nausea, no vomiting.  No diarrhea.  No constipation. Genitourinary: Negative for dysuria. Musculoskeletal: Negative for back pain. Skin: Negative for rash. Neurological: Negative for headaches, focal weakness or numbness.  10-point ROS otherwise negative.  ____________________________________________   PHYSICAL EXAM:  VITAL SIGNS: ED Triage Vitals  Enc Vitals Group     BP 03/31/16 2033 122/81 mmHg     Pulse Rate 03/31/16 2033 117     Resp 03/31/16 2033 18     Temp 03/31/16 2033 98.2 F (36.8 C)     Temp Source 03/31/16 2033 Oral     SpO2 03/31/16 2033 96 %     Weight  03/31/16 2033 210 lb (95.255 kg)     Height 03/31/16 2033 5\' 10"  (1.778 m)     Head Cir --      Peak Flow --      Pain Score 03/31/16 2035 0     Pain Loc --      Pain Edu? --      Excl. in GC? --     Constitutional: Alert and oriented. Well appearing and in no acute distress.Alcohol on breath.Eyes: Conjunctivae are normal. PERRL. EOMI. Head: Atraumatic. Nose: No congestion/rhinnorhea. Mouth/Throat: Mucous membranes are moist.   Neck: No stridor.   Cardiovascular: Normal rate, regular rhythm. Grossly normal heart sounds.   Respiratory: Normal respiratory effort.  No retractions. Lungs CTAB. Gastrointestinal: Soft and nontender. No distention.  No CVA tenderness. Musculoskeletal: No lower extremity tenderness nor edema.  No joint effusions. Neurologic:   No gross focal neurologic deficits are appreciated.  Skin:  Skin is warm,  dry and intact. No rash noted. Psychiatric: Mood and affect are normal. Speech and behavior are normal.  ____________________________________________   LABS (all labs ordered are listed, but only abnormal results are displayed)  Labs Reviewed  COMPREHENSIVE METABOLIC PANEL - Abnormal; Notable for the following:    Glucose, Bld 125 (*)    AST 117 (*)    ALT 117 (*)    All other components within normal limits  ETHANOL - Abnormal; Notable for the following:    Alcohol, Ethyl (B) 314 (*)    All other components within normal limits  ACETAMINOPHEN LEVEL - Abnormal; Notable for the following:    Acetaminophen (Tylenol), Serum <10 (*)    All other components within normal limits  CBC - Abnormal; Notable for the following:    WBC 14.2 (*)    All other components within normal limits  URINE DRUG SCREEN, QUALITATIVE ([facility] ONLY) - Abnormal; Notable for the following:    Cocaine Metabolite,Ur [place] POSITIVE (*)    All other components within normal limits  SALICYLATE LEVEL   ____________________________________________  EKG   ____________________________________________  RADIOLOGY   ____________________________________________   PROCEDURES    ____________________________________________   INITIAL IMPRESSION / ASSESSMENT AND PLAN / ED COURSE  Pertinent labs & imaging results that were available during my care of the patient were reviewed by me and considered in my medical decision making (see chart for details).  Appears to have a chronic transaminitis which is consistent with alcoholism. Patient informed that he will be involuntarily committed to be seen by the psychiatrist. He is understanding of this plan and willing to comply. ____________________________________________   FINAL CLINICAL IMPRESSION(S) / ED DIAGNOSES  Polysubstance abuse. Suicidal ideation.    NEW MEDICATIONS STARTED DURING THIS VISIT:  New Prescriptions   No medications on file     Note:   This document was prepared using Dragon voice recognition software and may include unintentional dictation errors.    Myrna Blazeravid Matthew Schaevitz, MD 03/31/16 2300

## 2016-03-31 NOTE — BH Assessment (Signed)
Assessment Note  [name] [name] is an 45 y.o. male. [name] [name] arrived to the ED by way of the [facility].  He reports that he wants to go to "[facility]". He reports that he came to the ED because he could not get a ride there.  When asked are you feeling depressed or anxious, he replied "Yes", when asked which he replies "Both".  He reports that he has not slept in 4 days.  Depression is depression. "I go freakin crazy.  He denied having auditory or visual hallucinations.  He reports suicidal ideation. When asked if he has a plan, He states, "I have many plans", You don't have to go text book with it.  He denied homicidal ideation or intent.  He reports drinking alcohol, drinking about 30-40 beers of various sizes.  Denied the use of drugs.  UDS reports positive for Cocaine. BAL = 314. [name] [name] did appear to be a reliable reported.  Diagnosis: Depression, Alcohol abuse  Past Medical History:  Past Medical History  Diagnosis Date  . Cirrhosis of liver not due to alcohol (HCC)   . Depression   . Anxiety   . Bipolar affect, depressed (HCC)     Past Surgical History  Procedure Laterality Date  . Hernia repair      Family History: No family history on file.  Social History:  reports that he has been smoking.  He does not have any smokeless tobacco history on file. He reports that he drinks alcohol. He reports that he does not use illicit drugs.  Additional Social History:  Alcohol / Drug Use History of alcohol / drug use?: Yes Substance #1 Name of Substance 1: Alcohol 1 - Age of First Use: 1 1 - Amount (size/oz): a lot 1 - Frequency: daily 1 - Last Use / Amount: 03/31/2016  CIWA: CIWA-Ar BP: 122/81 mmHg Pulse Rate: (!) 117 Nausea and Vomiting: no nausea and no vomiting Tactile Disturbances: none Tremor: no tremor Auditory Disturbances: not present Paroxysmal Sweats: barely perceptible sweating, palms moist Visual Disturbances: not present Anxiety: mildly  anxious Headache, Fullness in Head: none present Agitation: normal activity Orientation and Clouding of Sensorium: oriented and can do serial additions CIWA-Ar Total: 2 COWS:    Allergies:  Allergies  Allergen Reactions  . Coconut Flavor Hives    Home Medications:  (Not in a hospital admission)  OB/GYN Status:  No LMP for male patient.  General Assessment Data Location of Assessment: [facility] ED TTS Assessment: In system Is this a Tele or Face-to-Face Assessment?: Face-to-Face Is this an Initial Assessment or a Re-assessment for this encounter?: Initial Assessment Marital status: Long term relationship Maiden name: n/a Is patient pregnant?: No Pregnancy Status: No Living Arrangements: Spouse/significant other Can pt return to current living arrangement?: Yes Admission Status: Involuntary Is patient capable of signing voluntary admission?: Yes Referral Source: Self/Family/Friend Insurance type: None  Medical Screening Exam [facility] Walk-in ONLY) Medical Exam completed: Yes  Crisis Care Plan Living Arrangements: Spouse/significant other Legal Guardian: Other: (Self) Name of Psychiatrist: None Name of Therapist: None  Education Status Is patient currently in school?: No Current Grade: n/a Highest grade of school patient has completed: 9th Name of school: [employer] person: n/a  Risk to self with the past 6 months Suicidal Ideation: Yes-Currently Present Has patient been a risk to self within the past 6 months prior to admission? : No Suicidal Intent: No-Not Currently/Within Last 6 Months Has patient had any suicidal intent within the past  6 months prior to admission? : No Is patient at risk for suicide?: No Suicidal Plan?: Yes-Currently Present Has patient had any suicidal plan within the past 6 months prior to admission? : No Specify Current Suicidal Plan: Would not elaborate. Access to Means: No (Currently in the hospital) What has been your use of  drugs/alcohol within the last 12 months?: Daily use of alcohol Previous Attempts/Gestures: Yes How many times?: 2 Other Self Harm Risks: denied Triggers for Past Attempts: Unknown Intentional Self Injurious Behavior: None Family Suicide History: No Recent stressful life event(s): Conflict (Comment) (relationship problems, homeless) Persecutory voices/beliefs?: No Depression: Yes Depression Symptoms:  (substance abuse) Substance abuse history and/or treatment for substance abuse?: Yes Suicide prevention information given to non-admitted patients: Not applicable  Risk to Others within the past 6 months Homicidal Ideation: No Does patient have any lifetime risk of violence toward others beyond the six months prior to admission? : No Thoughts of Harm to Others: No Current Homicidal Intent: No Current Homicidal Plan: No Access to Homicidal Means: No Identified Victim: None reported History of harm to others?: No Assessment of Violence: None Noted Violent Behavior Description: denied Does patient have access to weapons?: No Criminal Charges Pending?: No Does patient have a court date: No Is patient on probation?: No  Psychosis Hallucinations: None noted Delusions: None noted  Mental Status Report Appearance/Hygiene: In scrubs Eye Contact: Fair Motor Activity: Unremarkable Speech: Unremarkable Level of Consciousness: Alert Mood: Irritable Affect: Irritable Anxiety Level: None Thought Processes: Coherent Judgement: Impaired Orientation: Person, Place, Time, Situation Obsessive Compulsive Thoughts/Behaviors: None  Cognitive Functioning Concentration: Normal Memory: Recent Intact IQ: Average Insight: Fair Impulse Control: Fair Appetite: Fair Sleep: Decreased Vegetative Symptoms: None  ADLScreening [facility] Assessment Services) Patient's cognitive ability adequate to safely complete daily activities?: Yes Patient able to express need for assistance with ADLs?:  Yes Independently performs ADLs?: Yes (appropriate for developmental age)  Prior Inpatient Therapy Prior Inpatient Therapy: Yes Prior Therapy Dates: Unsure [facility] Reason for Treatment: SI  Prior Outpatient Therapy Prior Outpatient Therapy: Yes Prior Therapy Dates: 2016 [facility] Reason for Treatment: Alcohol abuse Does patient have an ACCT team?: No Does patient have Intensive In-House Services?  : No Does patient have [facility] services? : No Does patient have P4CC services?: No  ADL Screening (condition at time of admission) Patient's cognitive ability adequate to safely complete daily activities?: Yes Patient able to express need for assistance with ADLs?: Yes Independently performs ADLs?: Yes (appropriate for developmental age)       Abuse/Neglect Assessment (Assessment to be complete while patient is alone) Physical Abuse: Denies Verbal Abuse: Denies Sexual Abuse: Denies Exploitation of patient/patient's resources: Denies Self-Neglect: Denies Values / Beliefs Cultural Requests During Hospitalization: None   Advance Directives (For Healthcare) Does patient have an advance directive?: No Would patient like information on creating an advanced directive?: Yes - Educational materials given    Additional Information 1:1 In Past 12 Months?: No CIRT Risk: No Elopement Risk: No Does patient have medical clearance?: No     Disposition:  Disposition Initial Assessment Completed for this Encounter: Yes Disposition of Patient: Other dispositions  On Site Evaluation by:   Reviewed with Physician:    Justice DeedsKeisha Yesenia Locurto 03/31/2016 11:46 PM

## 2016-04-01 ENCOUNTER — Encounter (HOSPITAL_COMMUNITY): Payer: Self-pay

## 2016-04-01 ENCOUNTER — Inpatient Hospital Stay (HOSPITAL_COMMUNITY)
Admission: AD | Admit: 2016-04-01 | Discharge: 2016-04-09 | DRG: 897 | Disposition: A | Discharge status: Home / Self Care | Payer: No Typology Code available for payment source | Source: Intra-hospital | Attending: Psychiatry | Admitting: Psychiatry

## 2016-04-01 DIAGNOSIS — Z800 Family history of malignant neoplasm of digestive organs: Secondary | ICD-10-CM

## 2016-04-01 DIAGNOSIS — F410 Panic disorder [episodic paroxysmal anxiety] without agoraphobia: Secondary | ICD-10-CM | POA: Diagnosis present

## 2016-04-01 DIAGNOSIS — F1020 Alcohol dependence, uncomplicated: Secondary | ICD-10-CM | POA: Diagnosis present

## 2016-04-01 DIAGNOSIS — K7460 Unspecified cirrhosis of liver: Secondary | ICD-10-CM | POA: Diagnosis present

## 2016-04-01 DIAGNOSIS — F1994 Other psychoactive substance use, unspecified with psychoactive substance-induced mood disorder: Secondary | ICD-10-CM | POA: Diagnosis present

## 2016-04-01 DIAGNOSIS — F6381 Intermittent explosive disorder: Secondary | ICD-10-CM | POA: Diagnosis present

## 2016-04-01 DIAGNOSIS — F17210 Nicotine dependence, cigarettes, uncomplicated: Secondary | ICD-10-CM | POA: Diagnosis present

## 2016-04-01 DIAGNOSIS — Z62810 Personal history of physical and sexual abuse in childhood: Secondary | ICD-10-CM | POA: Diagnosis present

## 2016-04-01 DIAGNOSIS — F1420 Cocaine dependence, uncomplicated: Secondary | ICD-10-CM | POA: Diagnosis present

## 2016-04-01 DIAGNOSIS — F1090 Alcohol use, unspecified, uncomplicated: Secondary | ICD-10-CM | POA: Diagnosis present

## 2016-04-01 DIAGNOSIS — Z818 Family history of other mental and behavioral disorders: Secondary | ICD-10-CM

## 2016-04-01 DIAGNOSIS — IMO0002 Reserved for concepts with insufficient information to code with codable children: Secondary | ICD-10-CM | POA: Diagnosis present

## 2016-04-01 DIAGNOSIS — F1024 Alcohol dependence with alcohol-induced mood disorder: Principal | ICD-10-CM | POA: Diagnosis present

## 2016-04-01 DIAGNOSIS — G4700 Insomnia, unspecified: Secondary | ICD-10-CM | POA: Diagnosis present

## 2016-04-01 DIAGNOSIS — R45851 Suicidal ideations: Secondary | ICD-10-CM | POA: Diagnosis present

## 2016-04-01 DIAGNOSIS — G2581 Restless legs syndrome: Secondary | ICD-10-CM | POA: Diagnosis present

## 2016-04-01 DIAGNOSIS — Z915 Personal history of self-harm: Secondary | ICD-10-CM

## 2016-04-01 DIAGNOSIS — F4323 Adjustment disorder with mixed anxiety and depressed mood: Secondary | ICD-10-CM | POA: Diagnosis present

## 2016-04-01 LAB — ETHANOL
ALCOHOL ETHYL (B): 115 mg/dL — AB (ref <5)
ALCOHOL ETHYL (B): 241 mg/dL — AB (ref <5)

## 2016-04-01 MED ORDER — ALUM & MAG HYDROXIDE-SIMETH 200-200-20 MG/5ML PO SUSP
30.0000 mL | ORAL | Status: DC | PRN
  Administered 2016-04-05 – 2016-04-08 (×3): 30 mL via ORAL
  Filled 2016-04-01 (×3): qty 30

## 2016-04-01 MED ORDER — IBUPROFEN 600 MG PO TABS
600.0000 mg | ORAL_TABLET | Freq: Once | ORAL | Status: AC
  Administered 2016-04-01: 600 mg via ORAL

## 2016-04-01 MED ORDER — LORAZEPAM 1 MG PO TABS
1.0000 mg | ORAL_TABLET | Freq: Four times a day (QID) | ORAL | Status: AC | PRN
  Administered 2016-04-01 – 2016-04-03 (×3): 1 mg via ORAL
  Filled 2016-04-01 (×4): qty 1

## 2016-04-01 MED ORDER — GABAPENTIN 100 MG PO CAPS
100.0000 mg | ORAL_CAPSULE | Freq: Three times a day (TID) | ORAL | Status: DC
  Administered 2016-04-01 – 2016-04-06 (×14): 100 mg via ORAL
  Filled 2016-04-01 (×18): qty 1

## 2016-04-01 MED ORDER — ACETAMINOPHEN 325 MG PO TABS: 650.0000 mg | ORAL_TABLET | Freq: Four times a day (QID) | ORAL | Status: DC | PRN

## 2016-04-01 MED ORDER — MAGNESIUM HYDROXIDE 400 MG/5ML PO SUSP: 30.0000 mL | Freq: Every day | ORAL | Status: DC | PRN

## 2016-04-01 MED ORDER — LORAZEPAM 1 MG PO TABS
1.0000 mg | ORAL_TABLET | Freq: Two times a day (BID) | ORAL | Status: AC
  Administered 2016-04-04 (×2): 1 mg via ORAL
  Filled 2016-04-01 (×2): qty 1

## 2016-04-01 MED ORDER — LOPERAMIDE HCL 2 MG PO CAPS
2.0000 mg | ORAL_CAPSULE | ORAL | Status: AC | PRN
  Administered 2016-04-01: 4 mg via ORAL
  Filled 2016-04-01: qty 2

## 2016-04-01 MED ORDER — LORAZEPAM 1 MG PO TABS
1.0000 mg | ORAL_TABLET | Freq: Three times a day (TID) | ORAL | Status: AC
  Administered 2016-04-03 (×3): 1 mg via ORAL
  Filled 2016-04-01 (×3): qty 1

## 2016-04-01 MED ORDER — NICOTINE 21 MG/24HR TD PT24
21.0000 mg | MEDICATED_PATCH | Freq: Every day | TRANSDERMAL | Status: DC
  Administered 2016-04-01 – 2016-04-09 (×9): 21 mg via TRANSDERMAL
  Filled 2016-04-01 (×10): qty 1

## 2016-04-01 MED ORDER — LORAZEPAM 1 MG PO TABS
ORAL_TABLET | ORAL | Status: AC
  Administered 2016-04-01: 1 mg via ORAL
  Filled 2016-04-01: qty 1

## 2016-04-01 MED ORDER — ONDANSETRON 4 MG PO TBDP: 4.0000 mg | ORAL_TABLET | Freq: Four times a day (QID) | ORAL | Status: AC | PRN

## 2016-04-01 MED ORDER — LORAZEPAM 1 MG PO TABS
1.0000 mg | ORAL_TABLET | Freq: Four times a day (QID) | ORAL | Status: AC
  Administered 2016-04-01 – 2016-04-02 (×6): 1 mg via ORAL
  Filled 2016-04-01 (×5): qty 1

## 2016-04-01 MED ORDER — BUSPIRONE HCL 5 MG PO TABS
5.0000 mg | ORAL_TABLET | Freq: Two times a day (BID) | ORAL | Status: DC
  Administered 2016-04-01 – 2016-04-09 (×16): 5 mg via ORAL
  Filled 2016-04-01 (×19): qty 1

## 2016-04-01 MED ORDER — TRAZODONE HCL 50 MG PO TABS
50.0000 mg | ORAL_TABLET | Freq: Every evening | ORAL | Status: DC | PRN
  Administered 2016-04-01 – 2016-04-06 (×6): 50 mg via ORAL
  Filled 2016-04-01: qty 21
  Filled 2016-04-01 (×6): qty 1

## 2016-04-01 MED ORDER — CITALOPRAM HYDROBROMIDE 10 MG PO TABS
10.0000 mg | ORAL_TABLET | Freq: Every day | ORAL | Status: DC
  Administered 2016-04-01 – 2016-04-02 (×2): 10 mg via ORAL
  Filled 2016-04-01 (×5): qty 1

## 2016-04-01 MED ORDER — VITAMIN B-1 100 MG PO TABS
100.0000 mg | ORAL_TABLET | Freq: Every day | ORAL | Status: DC
  Administered 2016-04-02 – 2016-04-09 (×8): 100 mg via ORAL
  Filled 2016-04-01 (×9): qty 1

## 2016-04-01 MED ORDER — THIAMINE HCL 100 MG/ML IJ SOLN: 100.0000 mg | Freq: Once | INTRAMUSCULAR | Status: DC

## 2016-04-01 MED ORDER — ADULT MULTIVITAMIN W/MINERALS CH
1.0000 | ORAL_TABLET | Freq: Every day | ORAL | Status: DC
  Administered 2016-04-01 – 2016-04-09 (×9): 1 via ORAL
  Filled 2016-04-01 (×11): qty 1

## 2016-04-01 MED ORDER — IBUPROFEN 400 MG PO TABS
400.0000 mg | ORAL_TABLET | Freq: Four times a day (QID) | ORAL | Status: DC | PRN
  Administered 2016-04-02: 400 mg via ORAL
  Filled 2016-04-01: qty 1

## 2016-04-01 MED ORDER — IBUPROFEN 600 MG PO TABS
ORAL_TABLET | ORAL | Status: AC
  Administered 2016-04-01: 600 mg via ORAL
  Filled 2016-04-01: qty 1

## 2016-04-01 MED ORDER — VITAMIN B-1 100 MG PO TABS
ORAL_TABLET | ORAL | Status: AC
  Administered 2016-04-01: 100 mg via ORAL
  Filled 2016-04-01: qty 1

## 2016-04-01 MED ORDER — HYDROXYZINE HCL 25 MG PO TABS: 25.0000 mg | ORAL_TABLET | Freq: Four times a day (QID) | ORAL | Status: AC | PRN

## 2016-04-01 MED ORDER — LORAZEPAM 1 MG PO TABS
1.0000 mg | ORAL_TABLET | Freq: Every day | ORAL | Status: AC
  Administered 2016-04-05: 1 mg via ORAL
  Filled 2016-04-01: qty 1

## 2016-04-01 NOTE — ED Notes (Signed)
Called [facility] at [phone] to transport [place]. Transport service stated they would be by momentarily to pick up patient.

## 2016-04-01 NOTE — ED Provider Notes (Signed)
-----------------------------------------   6:51 AM on 04/01/2016 -----------------------------------------   Blood pressure 107/71, pulse 94, temperature 97.9 F (36.6 C), temperature source Oral, resp. rate 18, height 5\' 10"  (1.778 m), weight 210 lb (95.255 kg), SpO2 93 %.  The patient had no acute events since last update.  Calm and cooperative at this time.  Disposition is pending per Psychiatry/Behavioral Medicine team recommendations.     Irean HongJade J Jenille Laszlo, MD 04/01/16 [phone]

## 2016-04-01 NOTE — ED Notes (Signed)
Patient currently in room resting. Patient received lunch tray. No signs of acute distress noted. Maintained on 15 minute checks and observation by security camera for safety.

## 2016-04-01 NOTE — ED Notes (Signed)
ENVIRONMENTAL ASSESSMENT Potentially harmful objects out of patient reach: Yes Personal belongings secured: Yes Patient dressed in hospital provided attire only: Yes Plastic bags out of patient reach: Yes Patient care equipment (cords, cables, call bells, lines, and drains) shortened, removed, or accounted for: Yes Equipment and supplies removed from bottom of stretcher: Yes Potentially toxic materials out of patient reach: Yes Sharps container removed or out of patient reach: Yes  Patient currently in room resting. No signs of distress noted. Maintained on 15 minute checks and observation by security camera for safety.  

## 2016-04-01 NOTE — ED Notes (Signed)
BEHAVIORAL HEALTH ROUNDING Patient sleeping: Yes.   Patient alert and oriented: sleeping Behavior appropriate: Yes.  ; If no, describe:  Nutrition and fluids offered: Yes  Toileting and hygiene offered: Yes  Sitter present: q 15 minute checks Law enforcement present: Yes  

## 2016-04-01 NOTE — ED Notes (Signed)
[facility]. Patient denies SI/HI/AVH and pain. All belongings taken with patient and given to transport service. Patient filled out belongings sheet and voluntary consent for treatment form. Patient transported to observation unit via [facility].

## 2016-04-01 NOTE — BH Assessment (Signed)
Patient accepted for [facility] Observ[facility]5 by Fransisca KaufmannLaura Davis NP. Attending MD Lucianne MussKumar, please call report to [phone].

## 2016-04-01 NOTE — ED Notes (Signed)
Patient was brought to the emergency room because he had "nowhere else to go". He claimed that he had suicidal ideation with no concrete plan because he recently became homeless. Patient currently denies SI/HI/AVH and says that he is depressed because he is homeless and because he really wants to get rid of his drinking problem. Patient currently endorses pain rated a 7/10 on the left side of his abdomen that he says is related to his cirrhosis of the liver. Patient does not endorse any symptoms of withdrawal at this time. Patient requests to go to alcohol treatment center so that he can stop drinking. Patient is calm and cooperative at this time. Maintained on 15 minute checks and observation by security camera for safety.

## 2016-04-01 NOTE — ED Notes (Signed)
Patient was informed of plan to go to observation unit. Patient agreeable to plan. Patient had no further questions at this time. No signs of acute distress noted. Maintained on 15 minute checks and observation by security camera for safety.

## 2016-04-01 NOTE — Progress Notes (Signed)
Pt resting in bed and watching TV at shift change.  Pt has signs/symptoms of ETOH detox and scores a CIWA of 11.  Pt concerned about possible early discharge and a return to his past behavior.  Pt [facility]  Pt  Is asking for referral to inpatient facility.  Pt sts he has previous experience with in pt facility and was sober for 11 months.  Pt requesting snacks and drinks.  Pt given snacks and drink.  Pt continuously observed for safety except when in Bathroom. Pt remains safe on unit.

## 2016-04-01 NOTE — Progress Notes (Signed)
Patient ID: Juel BurrowRobert J Evinger, male   DOB: 04/30/1971, 45 y.o.   MRN: [id number]  [facility], pt reports increasing depression anxiety and alcohol use since he stopped taking his depression and anxiety medication 5 months ago, pt states that he is homeless "living in the streets" and that his "medicaid stopped paying for my medications", pt recently had a falling out with his wife and states that "she wants me to get help for my drinking", pt denies SI/HI/AH presently, SI last on yesterday and states that he has been experiencing withdrawal symptoms that include visual hallucinations, pt has history of seizure when withdrawing from alcohol x 1 year ago, notified provider, skin/contraband search done, skin intact, no contraband found, oriented pt to unit and rules, pleasant and cooperative throughout the arrival process, pt belongings placed and secured in [facility].

## 2016-04-01 NOTE — Consult Note (Signed)
  Psychiatry: Brief note. Not currently at risk of suicide. Needs detox help. TTS looking into detox beds. IVC discontinued

## 2016-04-01 NOTE — Progress Notes (Signed)
Report given to Janeece RiggersAndrea RN at [facility]. Available bed, transport, and staff confirmed. Patient to be transported via [facility].

## 2016-04-01 NOTE — Discharge Instructions (Signed)
Return to the emergency department for severe pain, thoughts of hurting yourself or anyone else, hallucinations, or any other symptoms concerning to you.

## 2016-04-01 NOTE — ED Notes (Signed)
Pt sleeping. 

## 2016-04-01 NOTE — Consult Note (Signed)
[facility] Face-to-Face Psychiatry Consult   Reason for Consult:  Consult for 45 year old man with a history of alcohol abuse and multiple presentations. Came into the emergency room intoxicated and having some reported suicidal thoughts. Referring Physician:  Mariea Clonts Patient Identification: [name] [name] MRN:  [id number] Principal Diagnosis: Substance induced mood disorder [facility]) Diagnosis:   Patient Active Problem List   Diagnosis Date Noted  . Substance induced mood disorder ([place]) [F19.94] 04/01/2016  . Alcohol use disorder, severe, dependence ([place]) [F10.20] 09/10/2015  . Alcohol intoxication ([place]) [F10.129]     Total Time spent with patient: 1 hour  Subjective:   [name] [name] is a 45 y.o. male patient admitted with "I've got a lot of stuff going on".  HPI:  Patient interviewed. Chart reviewed. Labs and vitals reviewed. Patient came into the emergency room intoxicated. He said that he's been drinking heavily for the last several days. His wife kicked him out about 3 days ago and he has not had any place to stay since then. He says that he is drinking about a case of beer per day. His mood is feeling down and depressed. Feels somewhat hopeless. Feels nervous all the time. Sleeps poorly. Not eating very well. Patient has vague visual hallucinations but no other psychotic symptoms. He is not currently on any medication. He admits that he used cocaine a couple days ago but says that he has not been using cocaine on a regular basis. He reports having some suicidal thoughts but admits that he has no plan to kill himself and does not actually want to die. Mostly he wants to go to some kind of detox facility.  Social history: Not working. Wife recently threw him out. Patient just got out of jail recently. Sounds like he's had a lot of time in jail and prison throughout his life. Chaotic lifestyle.  Substance abuse history: Long history of alcohol abuse. He says that he's had a seizure once prior  before and possibly delirium tremens. He's been to [facility] at least once before but doesn't follow up with outpatient treatment. Most of his extended sobriety has taken place when he was locked up in jail.  Past Psychiatric History: Patient has had psychiatric hospitalizations in the past and has been prescribed medicines previously as well. Most recently he had been on Celexa BuSpar and Neurontin but it sounds that he hasn't been taking them in months probably. Not clear how much that ever helped since he was not staying sober at the time. Patient has repeatedly made statements about having thoughts to kill himself but has never tried to kill himself in the past.  Risk to Self: Suicidal Ideation: Yes-Currently Present Suicidal Intent: No-Not Currently/Within Last 6 Months Is patient at risk for suicide?: No Suicidal Plan?: Yes-Currently Present Specify Current Suicidal Plan: Would not elaborate. Access to Means: No (Currently in the hospital) What has been your use of drugs/alcohol within the last 12 months?: Daily use of alcohol How many times?: 2 Other Self Harm Risks: denied Triggers for Past Attempts: Unknown Intentional Self Injurious Behavior: None Risk to Others: Homicidal Ideation: No Thoughts of Harm to Others: No Current Homicidal Intent: No Current Homicidal Plan: No Access to Homicidal Means: No Identified Victim: None reported History of harm to others?: No Assessment of Violence: None Noted Violent Behavior Description: denied Does patient have access to weapons?: No Criminal Charges Pending?: No Does patient have a court date: No Prior Inpatient Therapy: Prior Inpatient Therapy: Yes Prior Therapy Dates: Unsure  Prior Therapy Facilty/Provider(s): [facility] Reason for Treatment: SI Prior Outpatient Therapy: Prior Outpatient Therapy: Yes Prior Therapy Dates: 2016 Prior Therapy Facilty/Provider(s): [facility] Reason for Treatment: Alcohol abuse Does patient have an  ACCT team?: No Does patient have Intensive In-House Services?  : No Does patient have [facility] services? : No Does patient have P4CC services?: No  Past Medical History:  Past Medical History  Diagnosis Date  . Cirrhosis of liver not due to alcohol ([place])   . Depression   . Anxiety   . Bipolar affect, depressed ([place])     Past Surgical History  Procedure Laterality Date  . Hernia repair     Family History: No family history on file. Family Psychiatric  History: Patient says he has an uncle who had some kind of mental health problem but there are a lot of people in his family with substance abuse problems Social History:  History  Alcohol Use  . Yes    Comment: case and 1/2 daily of beer     History  Drug Use No    Social History   Social History  . Marital Status: Single    Spouse Name: N/A  . Number of Children: N/A  . Years of Education: N/A   Social History Main Topics  . Smoking status: Current Every Day Smoker  . Smokeless tobacco: None  . Alcohol Use: Yes     Comment: case and 1/2 daily of beer  . Drug Use: No  . Sexual Activity: Not Asked   Other Topics Concern  . None   Social History Narrative   Additional Social History:    Allergies:   Allergies  Allergen Reactions  . Coconut Flavor Hives    Labs:  Results for orders placed or performed during the hospital encounter of 03/31/16 (from the past 48 hour(s))  Comprehensive metabolic panel     Status: Abnormal   Collection Time: 03/31/16  8:43 PM  Result Value Ref Range   Sodium 138 135 - 145 mmol/L   Potassium 3.5 3.5 - 5.1 mmol/L   Chloride 104 101 - 111 mmol/L   CO2 22 22 - 32 mmol/L   Glucose, Bld 125 (H) 65 - 99 mg/dL   BUN 8 6 - 20 mg/dL   Creatinine, Ser 0.82 0.61 - 1.24 mg/dL   Calcium 9.0 8.9 - 10.3 mg/dL   Total Protein 8.0 6.5 - 8.1 g/dL   Albumin 4.5 3.5 - 5.0 g/dL   AST 117 (H) 15 - 41 U/L   ALT 117 (H) 17 - 63 U/L   Alkaline Phosphatase 78 38 - 126 U/L   Total Bilirubin 0.9  0.3 - 1.2 mg/dL   GFR calc non Af Amer >60 >60 mL/min   GFR calc Af Amer >60 >60 mL/min    Comment: (NOTE) The eGFR has been calculated using the CKD EPI equation. This calculation has not been validated in all clinical situations. eGFR's persistently <60 mL/min signify possible Chronic Kidney Disease.    Anion gap 12 5 - 15  Ethanol     Status: Abnormal   Collection Time: 03/31/16  8:43 PM  Result Value Ref Range   Alcohol, Ethyl (B) 314 (HH) <5 mg/dL    Comment: CRITICAL RESULT CALLED TO, READ BACK BY AND VERIFIED WITH IRIS GUIDRY ON 03/31/16 AT [address] [facility]        LOWEST DETECTABLE LIMIT FOR SERUM ALCOHOL IS 5 mg/dL FOR MEDICAL PURPOSES ONLY   Salicylate level  Status: None   Collection Time: 03/31/16  8:43 PM  Result Value Ref Range   Salicylate Lvl <8.2 2.8 - 30.0 mg/dL  Acetaminophen level     Status: Abnormal   Collection Time: 03/31/16  8:43 PM  Result Value Ref Range   Acetaminophen (Tylenol), Serum <10 (L) 10 - 30 ug/mL    Comment:        THERAPEUTIC CONCENTRATIONS VARY SIGNIFICANTLY. A RANGE OF 10-30 ug/mL MAY BE AN EFFECTIVE CONCENTRATION FOR MANY PATIENTS. HOWEVER, SOME ARE BEST TREATED AT CONCENTRATIONS OUTSIDE THIS RANGE. ACETAMINOPHEN CONCENTRATIONS >150 ug/mL AT 4 HOURS AFTER INGESTION AND >50 ug/mL AT 12 HOURS AFTER INGESTION ARE OFTEN ASSOCIATED WITH TOXIC REACTIONS.   cbc     Status: Abnormal   Collection Time: 03/31/16  8:43 PM  Result Value Ref Range   WBC 14.2 (H) 3.8 - 10.6 K/uL   RBC 4.60 4.40 - 5.90 MIL/uL   Hemoglobin 15.3 13.0 - 18.0 g/dL   HCT 43.2 40.0 - 52.0 %   MCV 94.0 80.0 - 100.0 fL   MCH 33.2 26.0 - 34.0 pg   MCHC 35.4 32.0 - 36.0 g/dL   RDW 12.8 11.5 - 14.5 %   Platelets 320 150 - 440 K/uL  Urine Drug Screen, Qualitative     Status: Abnormal   Collection Time: 03/31/16  8:43 PM  Result Value Ref Range   Tricyclic, Ur Screen NONE DETECTED NONE DETECTED   Amphetamines, Ur Screen NONE DETECTED NONE DETECTED   MDMA  (Ecstasy)Ur Screen NONE DETECTED NONE DETECTED   Cocaine Metabolite,Ur [place] POSITIVE (A) NONE DETECTED   Opiate, Ur Screen NONE DETECTED NONE DETECTED   Phencyclidine (PCP) Ur S NONE DETECTED NONE DETECTED   Cannabinoid 50 Ng, Ur [place] NONE DETECTED NONE DETECTED   Barbiturates, Ur Screen NONE DETECTED NONE DETECTED   Benzodiazepine, Ur Scrn NONE DETECTED NONE DETECTED   Methadone Scn, Ur NONE DETECTED NONE DETECTED    Comment: (NOTE) 641  Tricyclics, urine               Cutoff 1000 ng/mL 200  Amphetamines, urine             Cutoff 1000 ng/mL 300  MDMA (Ecstasy), urine           Cutoff 500 ng/mL 400  Cocaine Metabolite, urine       Cutoff 300 ng/mL 500  Opiate, urine                   Cutoff 300 ng/mL 600  Phencyclidine (PCP), urine      Cutoff 25 ng/mL 700  Cannabinoid, urine              Cutoff 50 ng/mL 800  Barbiturates, urine             Cutoff 200 ng/mL 900  Benzodiazepine, urine           Cutoff 200 ng/mL 1000 Methadone, urine                Cutoff 300 ng/mL 1100 1200 The urine drug screen provides only a preliminary, unconfirmed 1300 analytical test result and should not be used for non-medical 1400 purposes. Clinical consideration and professional judgment should 1500 be applied to any positive drug screen result due to possible 1600 interfering substances. A more specific alternate chemical method 1700 must be used in order to obtain a confirmed analytical result.  1800 Gas chromato graphy / mass spectrometry (GC/MS) is the preferred 1900 confirmatory  method.   Ethanol     Status: Abnormal   Collection Time: 04/01/16 12:03 AM  Result Value Ref Range   Alcohol, Ethyl (B) 241 (H) <5 mg/dL    Comment:        LOWEST DETECTABLE LIMIT FOR SERUM ALCOHOL IS 5 mg/dL FOR MEDICAL PURPOSES ONLY   Ethanol     Status: Abnormal   Collection Time: 04/01/16  8:58 AM  Result Value Ref Range   Alcohol, Ethyl (B) 115 (H) <5 mg/dL    Comment:        LOWEST DETECTABLE LIMIT FOR SERUM  ALCOHOL IS 5 mg/dL FOR MEDICAL PURPOSES ONLY     Current Facility-Administered Medications  Medication Dose Route Frequency Provider Last Rate Last Dose  . gabapentin (NEURONTIN) tablet 300 mg  300 mg Oral QHS Orbie Pyo, MD   300 mg at 03/31/16 2234  . LORazepam (ATIVAN) tablet 0-4 mg  0-4 mg Oral Q6H Orbie Pyo, MD   1 mg at 04/01/16 1224   Followed by  . [START ON 04/03/2016] LORazepam (ATIVAN) tablet 0-4 mg  0-4 mg Oral Q12H Orbie Pyo, MD      . thiamine (VITAMIN B-1) tablet 100 mg  100 mg Oral Daily Orbie Pyo, MD   100 mg at 04/01/16 1049   Or  . thiamine (B-1) injection 100 mg  100 mg Intravenous Daily Orbie Pyo, MD       Current Outpatient Prescriptions  Medication Sig Dispense Refill  . busPIRone (BUSPAR) 5 MG tablet Take 1 tablet (5 mg total) by mouth 3 (three) times daily. 90 tablet 0  . citalopram (CELEXA) 20 MG tablet Take 1 tablet (20 mg total) by mouth daily. 30 tablet 0  . gabapentin (NEURONTIN) 100 MG capsule Take 2 capsules (200 mg total) by mouth 3 (three) times daily. 180 capsule 0  . Multiple Vitamin (MULTIVITAMIN WITH MINERALS) TABS tablet Take 1 tablet by mouth daily.    Marland Kitchen thiamine 100 MG tablet Take 1 tablet (100 mg total) by mouth daily.    . traZODone (DESYREL) 50 MG tablet Take 1 tablet (50 mg total) by mouth at bedtime as needed for sleep. 14 tablet 0    Musculoskeletal: Strength & Muscle Tone: decreased Gait & Station: unsteady Patient leans: N/A  Psychiatric Specialty Exam: Physical Exam  Nursing note and vitals reviewed. Constitutional: He appears well-developed and well-nourished.  HENT:  Head: Normocephalic and atraumatic.  Eyes: Conjunctivae are normal. Pupils are equal, round, and reactive to light.  Neck: Normal range of motion.  Cardiovascular: Regular rhythm and normal heart sounds.   Respiratory: Effort normal. No respiratory distress.  GI: Soft.  Musculoskeletal: Normal  range of motion.  Neurological: He is alert.  Skin: Skin is warm and dry.  Psychiatric: His mood appears anxious. His affect is blunt. His speech is delayed. He is slowed. He expresses impulsivity. He expresses suicidal ideation. He expresses no suicidal plans. He exhibits abnormal recent memory.    Review of Systems  Constitutional: Negative.   HENT: Negative.   Eyes: Negative.   Respiratory: Negative.   Cardiovascular: Negative.   Gastrointestinal: Positive for nausea.  Musculoskeletal: Negative.   Skin: Negative.   Neurological: Positive for tremors.  Psychiatric/Behavioral: Positive for depression, suicidal ideas and substance abuse. Negative for hallucinations and memory loss. The patient is nervous/anxious and has insomnia.     Blood pressure 112/68, pulse 93, temperature 97.9 F (36.6 C), temperature source Oral, resp. rate 18, height 5'  10" (1.778 m), weight 95.255 kg (210 lb), SpO2 93 %.Body mass index is 30.13 kg/(m^2).  General Appearance: Disheveled  Eye Contact:  Minimal  Speech:  Slow and Slurred  Volume:  Decreased  Mood:  Depressed  Affect:  Constricted  Thought Process:  Goal Directed  Orientation:  Full (Time, Place, and Person)  Thought Content:  Logical  Suicidal Thoughts:  Yes.  without intent/plan  Homicidal Thoughts:  No  Memory:  Immediate;   Good Recent;   Poor Remote;   Fair  Judgement:  Impaired  Insight:  Shallow  Psychomotor Activity:  Decreased  Concentration:  Concentration: Poor  Recall:  Poor  Fund of Knowledge:  Fair  Language:  Fair  Akathisia:  No  Handed:  Right  AIMS (if indicated):     Assets:  Desire for Improvement Resilience  ADL's:  Intact  Cognition:  WNL  Sleep:        Treatment Plan Summary: Daily contact with patient to assess and evaluate symptoms and progress in treatment, Medication management and Plan Patient is not currently reporting suicidal ideation however he did come into the emergency room intoxicated and  looks pretty rough. Claims she's had seizures in the past. Mood is down and dysphoric. He was requesting to go to substance abuse treatment. I have referred the matter 2 TTS and I am told that he has been accepted and that we can transfer him out to our TS. Case reviewed with emergency room doctor. No new prescriptions written. He can be discharged with detox follow-up  Disposition: Patient does not meet criteria for psychiatric inpatient admission. Supportive therapy provided about ongoing stressors.  Alethia Berthold, MD 04/01/2016 2:27 PM

## 2016-04-01 NOTE — ED Notes (Signed)
Patient asleep in room. No noted distress or abnormal behavior. Will continue 15 minute checks and observation by security cameras for safety. 

## 2016-04-01 NOTE — ED Notes (Signed)
Patient resting quietly in room. No noted distress or abnormal behaviors noted. Will continue 15 minute checks and observation by security camera for safety. 

## 2016-04-01 NOTE — ED Notes (Signed)
Patient offered snacks and water to drink. Patient is in room resting. No signs of distress noted at this time. Maintained on 15 minute checks and observation by security camera for safety.

## 2016-04-02 DIAGNOSIS — F6381 Intermittent explosive disorder: Secondary | ICD-10-CM | POA: Diagnosis present

## 2016-04-02 DIAGNOSIS — F17210 Nicotine dependence, cigarettes, uncomplicated: Secondary | ICD-10-CM | POA: Diagnosis present

## 2016-04-02 DIAGNOSIS — R45851 Suicidal ideations: Secondary | ICD-10-CM | POA: Diagnosis present

## 2016-04-02 DIAGNOSIS — Z818 Family history of other mental and behavioral disorders: Secondary | ICD-10-CM | POA: Diagnosis not present

## 2016-04-02 DIAGNOSIS — Z800 Family history of malignant neoplasm of digestive organs: Secondary | ICD-10-CM | POA: Diagnosis not present

## 2016-04-02 DIAGNOSIS — F1020 Alcohol dependence, uncomplicated: Secondary | ICD-10-CM | POA: Diagnosis not present

## 2016-04-02 DIAGNOSIS — K7460 Unspecified cirrhosis of liver: Secondary | ICD-10-CM | POA: Diagnosis present

## 2016-04-02 DIAGNOSIS — G2581 Restless legs syndrome: Secondary | ICD-10-CM | POA: Diagnosis present

## 2016-04-02 DIAGNOSIS — G4700 Insomnia, unspecified: Secondary | ICD-10-CM | POA: Diagnosis present

## 2016-04-02 DIAGNOSIS — F1994 Other psychoactive substance use, unspecified with psychoactive substance-induced mood disorder: Secondary | ICD-10-CM | POA: Diagnosis not present

## 2016-04-02 DIAGNOSIS — Z62810 Personal history of physical and sexual abuse in childhood: Secondary | ICD-10-CM | POA: Diagnosis present

## 2016-04-02 DIAGNOSIS — F4323 Adjustment disorder with mixed anxiety and depressed mood: Secondary | ICD-10-CM | POA: Diagnosis present

## 2016-04-02 DIAGNOSIS — F410 Panic disorder [episodic paroxysmal anxiety] without agoraphobia: Secondary | ICD-10-CM | POA: Diagnosis present

## 2016-04-02 DIAGNOSIS — Z915 Personal history of self-harm: Secondary | ICD-10-CM | POA: Diagnosis not present

## 2016-04-02 DIAGNOSIS — F1099 Alcohol use, unspecified with unspecified alcohol-induced disorder: Secondary | ICD-10-CM | POA: Diagnosis not present

## 2016-04-02 DIAGNOSIS — F1024 Alcohol dependence with alcohol-induced mood disorder: Secondary | ICD-10-CM | POA: Diagnosis present

## 2016-04-02 DIAGNOSIS — F1420 Cocaine dependence, uncomplicated: Secondary | ICD-10-CM | POA: Diagnosis present

## 2016-04-02 NOTE — Progress Notes (Signed)
Treatment Plan/Recommendations:  Will continue with current medications and recommendations as listed above. Patient continues to meet criteria for inpatient admission. Patient interviewed. Chart reviewed. Labs and vitals reviewed. Patient continues to present with depressive symptoms and expresses passive SI. He is wanting to get help for his substance abuse.   Will d/c from Observation and admit to unit 300- 304-2  For Substance Induced Mood D/O, Adjustment disorder w/ mixed anxiety and depressive features. Pt is aware of admission. Average length of stay 3-5 days.  Review of chart, vital signs, medications, and notes.  1-Medication management for depression and anxiety: Medications reviewed with the patient and she stated no untoward effects, unchanged. 2-Coping skills for depression, anxiety  3-Continue crisis stabilization and management  4-Address health issues--monitoring vital signs, stable  5-Treatment plan in progress to prevent relapse of depression and anxiety.

## 2016-04-02 NOTE — Progress Notes (Signed)
Admission Note:  45 year old male presents voluntary in no acute distress for the treatment of Alcoholism, Substance Abuse, and SI. Patient reports "My wife kicked me out house due to my drinking".  Patient appears flat and depressed. Patient was calm and cooperative with admission process. Patient presents with passive SI and contracts for safety upon admission. Patient states "thoughts come and go and I have had many plans" in regards to SI.  Patient reports AVH stating "I see faces and I hear voices telling me to do things I shouldn't do".  Patient reports that he has been "stayinging in the streets homeless for 5 days".  Patient identifies stressors as marital issues, financial issues, and recently loss custody of daughter.  Patient reports Cirrhosis of the liver.   Skin was assessed by OBS nurse.  Patient has reports #27 tattoos.  POC and unit policies explained and understanding verbalized. Consents obtained.  Patient had no additional questions or concerns.

## 2016-04-02 NOTE — Progress Notes (Signed)
  Report received from admitting RN, Otto Herbreka. Pt currently presents with a flat affect and depressed behavior. Pt supported emotionally and encouraged to express concerns and questions. Pt's safety ensured with 15 minute and environmental checks. Pt currently denies SI/HI and A/V hallucinations. Pt verbally agrees to seek staff if SI/HI or A/VH occurs and to consult with staff before acting on any harmful thoughts. Will continue POC.

## 2016-04-02 NOTE — Tx Team (Signed)
Initial Interdisciplinary Treatment Plan   PATIENT STRESSORS: Financial difficulties Health problems Loss of custody of daughter Marital or family conflict Substance abuse   PATIENT STRENGTHS: Ability for insight Average or above average intelligence Communication skills General fund of knowledge Motivation for treatment/growth   PROBLEM LIST: Problem List/Patient Goals Date to be addressed Date deferred Reason deferred Estimated date of resolution  At risk for suicide 04/02/2016  04/02/2016   D/C  Substance Abuse 04/02/2016  04/02/2016   D/C  Depression 04/02/2016  04/02/2016   D/C  "Trying to get family back" 04/02/2016  04/02/2016   D/C  "Trying to stop drinking alcohol" 04/02/2016  04/02/2016   D/C  "Mood swings and always isolating" 04/02/2016  04/02/2016   D/C                     DISCHARGE CRITERIA:  Adequate post-discharge living arrangements Improved stabilization in mood, thinking, and/or behavior Motivation to continue treatment in a less acute level of care Need for constant or close observation no longer present Reduction of life-threatening or endangering symptoms to within safe limits Withdrawal symptoms are absent or subacute and managed without 24-hour nursing intervention  PRELIMINARY DISCHARGE PLAN: Attend 12-step recovery group Outpatient therapy Participate in family therapy Placement in alternative living arrangements  PATIENT/FAMIILY INVOLVEMENT: This treatment plan has been presented to and reviewed with the patient, Marveen ReeksRobert J Mayville.  The patient and family have been given the opportunity to ask questions and make suggestions.  Larry SierrasMiddleton, Latravious Levitt P 04/02/2016, 5:40 PM

## 2016-04-02 NOTE — H&P (Signed)
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 Patient Active Problem List   Diagnosis Date Noted  . Substance induced mood disorder ([place]) [F19.94] 04/01/2016  . Alcohol use disorder ([place]) [F10.99] 04/01/2016  . Alcohol use disorder, severe, dependence ([place]) [F10.20] 09/10/2015  . Alcohol intoxication ([place]) [F10.129]    History of Present Illness:  Mr. Daughety was admitted to the Observation Unit yesterday after initial psychiatric consultation due to substance Induced mood Disorder and adjustment disorder with mixed anxiety and depressive features. Further details of this admission are attached below Patient interviewed. Chart reviewed. Labs and vitals reviewed. Patient came into the emergency room intoxicated. He said that he's been drinking heavily for the last several days. His wife kicked him out about 3 days ago and he has not had any place to stay since then. He says that he is drinking about a case of beer per day. His mood is feeling down and depressed. Feels somewhat hopeless. Feels nervous all the time. Sleeps poorly. Not eating very well. Patient has vague visual hallucinations but no other psychotic symptoms. He is not currently on any medication. He admits that he used cocaine a couple days ago but says that he has not been using cocaine on a regular basis. He reports having some suicidal thoughts but admits that he has no plan to kill himself and does not actually want to die. Mostly he wants to go to some kind of detox facility.  Social history: Not working. Wife recently threw him out. Patient just got out of jail recently. Sounds like he's had a lot of time in jail and prison throughout his life. Chaotic  lifestyle.  Substance abuse history: Long history of alcohol abuse. He says that he's had a seizure once prior before and possibly delirium tremens. He's been to [facility] at least once before but doesn't follow up with outpatient treatment. Most of his extended sobriety has taken place when he was locked up in jail.  Past Psychiatric History: Patient has had psychiatric hospitalizations in the past and has been prescribed medicines previously as well. Most recently he had been on Celexa BuSpar and Neurontin but it sounds that he hasn't been taking them in months probably. Not clear how much that ever helped since he was not staying sober at the time. Patient has repeatedly made statements about having thoughts to kill himself but has never tried to kill himself in the past.  Associated Signs/Symptoms: Depression Symptoms:  anhedonia, hopelessness, suicidal thoughts without plan, (Hypo) Manic Symptoms:  n/a Anxiety Symptoms:  Excessive Worry, Psychotic Symptoms:  Hallucinations: Visual PTSD Symptoms: Negative Total Time spent with patient: 20 minutes  Past Psychiatric History: See HPI  Is the patient at risk to self? Yes.    Has the patient been a risk to self in the past 6 months? Yes.    Has the patient been a risk to self within the distant past? Yes.    Is the patient a risk to others? No.  Has the patient been a risk to others in the past 6 months? No.  Has the patient been a risk to others within the distant past? No.   Prior Inpatient Therapy:  Unknown Prior Outpatient Therapy:  yes  Alcohol Screening:   Substance Abuse History in the last 12 months:  Yes.   Consequences of Substance Abuse: Family Consequences:  estranged spouse Previous Psychotropic Medications: Yes  Psychological Evaluations: Yes  Past Medical History:  Past Medical History  Diagnosis Date  . Cirrhosis of liver not due to alcohol ([place])   . Depression   . Anxiety   . Bipolar affect, depressed  ([place])     Past Surgical History  Procedure Laterality Date  . Hernia repair     Family History: History reviewed. No pertinent family history. Family Psychiatric History: Unknown Tobacco Screening: @FLOW ([phone])::1)@ Social History:  History  Alcohol Use  . Yes    Comment: case and 1/2 daily of beer (30 to 40 beers daily)     History  Drug Use No    Additional Social History:                           Allergies:   Allergies  Allergen Reactions  . Coconut Flavor Hives   Lab Results:  Results for orders placed or performed during the hospital encounter of 03/31/16 (from the past 48 hour(s))  Comprehensive metabolic panel     Status: Abnormal   Collection Time: 03/31/16  8:43 PM  Result Value Ref Range   Sodium 138 135 - 145 mmol/L   Potassium 3.5 3.5 - 5.1 mmol/L   Chloride 104 101 - 111 mmol/L   CO2 22 22 - 32 mmol/L   Glucose, Bld 125 (H) 65 - 99 mg/dL   BUN 8 6 - 20 mg/dL   Creatinine, Ser 0.82 0.61 - 1.24 mg/dL   Calcium 9.0 8.9 - 10.3 mg/dL   Total Protein 8.0 6.5 - 8.1 g/dL   Albumin 4.5 3.5 - 5.0 g/dL   AST 117 (H) 15 - 41 U/L   ALT 117 (H) 17 - 63 U/L   Alkaline Phosphatase 78 38 - 126 U/L   Total Bilirubin 0.9 0.3 - 1.2 mg/dL   GFR calc non Af Amer >60 >60 mL/min   GFR calc Af Amer >60 >60 mL/min    Comment: (NOTE) The eGFR has been calculated using the CKD EPI equation. This calculation has not been validated in all clinical situations. eGFR's persistently <60 mL/min signify possible Chronic Kidney Disease.    Anion gap 12 5 - 15  Ethanol     Status: Abnormal   Collection Time: 03/31/16  8:43 PM  Result Value Ref Range   Alcohol, Ethyl (B) 314 (HH) <5 mg/dL    Comment: CRITICAL RESULT CALLED TO, READ BACK BY AND VERIFIED WITH IRIS GUIDRY ON 03/31/16 AT [address] [facility]        LOWEST DETECTABLE LIMIT FOR SERUM ALCOHOL IS 5 mg/dL FOR MEDICAL PURPOSES ONLY   Salicylate level     Status: None   Collection Time: 03/31/16  8:43 PM  Result  Value Ref Range   Salicylate Lvl <1.5 2.8 - 30.0 mg/dL  Acetaminophen level     Status: Abnormal   Collection Time: 03/31/16  8:43 PM  Result Value Ref Range   Acetaminophen (Tylenol), Serum <10 (L) 10 - 30 ug/mL    Comment:        THERAPEUTIC CONCENTRATIONS VARY SIGNIFICANTLY. A RANGE OF 10-30 ug/mL MAY BE AN EFFECTIVE CONCENTRATION FOR MANY PATIENTS. HOWEVER, SOME ARE BEST TREATED AT CONCENTRATIONS OUTSIDE THIS RANGE. ACETAMINOPHEN CONCENTRATIONS >150 ug/mL AT 4 HOURS AFTER INGESTION AND >50 ug/mL AT 12 HOURS AFTER INGESTION ARE OFTEN ASSOCIATED WITH TOXIC REACTIONS.   cbc  Status: Abnormal   Collection Time: 03/31/16  8:43 PM  Result Value Ref Range   WBC 14.2 (H) 3.8 - 10.6 K/uL   RBC 4.60 4.40 - 5.90 MIL/uL   Hemoglobin 15.3 13.0 - 18.0 g/dL   HCT 43.2 40.0 - 52.0 %   MCV 94.0 80.0 - 100.0 fL   MCH 33.2 26.0 - 34.0 pg   MCHC 35.4 32.0 - 36.0 g/dL   RDW 12.8 11.5 - 14.5 %   Platelets 320 150 - 440 K/uL  Urine Drug Screen, Qualitative     Status: Abnormal   Collection Time: 03/31/16  8:43 PM  Result Value Ref Range   Tricyclic, Ur Screen NONE DETECTED NONE DETECTED   Amphetamines, Ur Screen NONE DETECTED NONE DETECTED   MDMA (Ecstasy)Ur Screen NONE DETECTED NONE DETECTED   Cocaine Metabolite,Ur [place] POSITIVE (A) NONE DETECTED   Opiate, Ur Screen NONE DETECTED NONE DETECTED   Phencyclidine (PCP) Ur S NONE DETECTED NONE DETECTED   Cannabinoid 50 Ng, Ur [place] NONE DETECTED NONE DETECTED   Barbiturates, Ur Screen NONE DETECTED NONE DETECTED   Benzodiazepine, Ur Scrn NONE DETECTED NONE DETECTED   Methadone Scn, Ur NONE DETECTED NONE DETECTED    Comment: (NOTE) 045  Tricyclics, urine               Cutoff 1000 ng/mL 200  Amphetamines, urine             Cutoff 1000 ng/mL 300  MDMA (Ecstasy), urine           Cutoff 500 ng/mL 400  Cocaine Metabolite, urine       Cutoff 300 ng/mL 500  Opiate, urine                   Cutoff 300 ng/mL 600  Phencyclidine (PCP), urine       Cutoff 25 ng/mL 700  Cannabinoid, urine              Cutoff 50 ng/mL 800  Barbiturates, urine             Cutoff 200 ng/mL 900  Benzodiazepine, urine           Cutoff 200 ng/mL 1000 Methadone, urine                Cutoff 300 ng/mL 1100 1200 The urine drug screen provides only a preliminary, unconfirmed 1300 analytical test result and should not be used for non-medical 1400 purposes. Clinical consideration and professional judgment should 1500 be applied to any positive drug screen result due to possible 1600 interfering substances. A more specific alternate chemical method 1700 must be used in order to obtain a confirmed analytical result.  1800 Gas chromato graphy / mass spectrometry (GC/MS) is the preferred 1900 confirmatory method.   Ethanol     Status: Abnormal   Collection Time: 04/01/16 12:03 AM  Result Value Ref Range   Alcohol, Ethyl (B) 241 (H) <5 mg/dL    Comment:        LOWEST DETECTABLE LIMIT FOR SERUM ALCOHOL IS 5 mg/dL FOR MEDICAL PURPOSES ONLY   Ethanol     Status: Abnormal   Collection Time: 04/01/16  8:58 AM  Result Value Ref Range   Alcohol, Ethyl (B) 115 (H) <5 mg/dL    Comment:        LOWEST DETECTABLE LIMIT FOR SERUM ALCOHOL IS 5 mg/dL FOR MEDICAL PURPOSES ONLY     Blood Alcohol level:  Lab Results  Component Value Date  ETH 115* 04/01/2016   ETH 241* 25/36/6440    Metabolic Disorder Labs:  No results found for: HGBA1C, MPG No results found for: PROLACTIN No results found for: CHOL, TRIG, HDL, CHOLHDL, VLDL, LDLCALC  Current Medications: Current Facility-Administered Medications  Medication Dose Route Frequency Provider Last Rate Last Dose  . alum & mag hydroxide-simeth (MAALOX/MYLANTA) 200-200-20 MG/5ML suspension 30 mL  30 mL Oral Q4H PRN Niel Hummer, NP      . busPIRone (BUSPAR) tablet 5 mg  5 mg Oral BID Niel Hummer, NP   5 mg at 04/01/16 1937  . citalopram (CELEXA) tablet 10 mg  10 mg Oral Daily Niel Hummer, NP   10 mg at 04/01/16  1938  . gabapentin (NEURONTIN) capsule 100 mg  100 mg Oral TID Niel Hummer, NP   100 mg at 04/01/16 1937  . hydrOXYzine (ATARAX/VISTARIL) tablet 25 mg  25 mg Oral Q6H PRN Niel Hummer, NP      . ibuprofen (ADVIL,MOTRIN) tablet 400 mg  400 mg Oral Q6H PRN Niel Hummer, NP      . loperamide (IMODIUM) capsule 2-4 mg  2-4 mg Oral PRN Niel Hummer, NP   4 mg at 04/01/16 1801  . LORazepam (ATIVAN) tablet 1 mg  1 mg Oral Q6H PRN Niel Hummer, NP   1 mg at 04/02/16 0002  . LORazepam (ATIVAN) tablet 1 mg  1 mg Oral QID Niel Hummer, NP   1 mg at 04/01/16 2209   Followed by  . [START ON 04/03/2016] LORazepam (ATIVAN) tablet 1 mg  1 mg Oral TID Niel Hummer, NP       Followed by  . [START ON 04/04/2016] LORazepam (ATIVAN) tablet 1 mg  1 mg Oral BID Niel Hummer, NP       Followed by  . [START ON 04/05/2016] LORazepam (ATIVAN) tablet 1 mg  1 mg Oral Daily Niel Hummer, NP      . magnesium hydroxide (MILK OF MAGNESIA) suspension 30 mL  30 mL Oral Daily PRN Niel Hummer, NP      . multivitamin with minerals tablet 1 tablet  1 tablet Oral Daily Niel Hummer, NP   1 tablet at 04/01/16 1801  . nicotine (NICODERM CQ - dosed in mg/24 hours) patch 21 mg  21 mg Transdermal Daily Niel Hummer, NP   21 mg at 04/01/16 1847  . ondansetron (ZOFRAN-ODT) disintegrating tablet 4 mg  4 mg Oral Q6H PRN Niel Hummer, NP      . thiamine (VITAMIN B-1) tablet 100 mg  100 mg Oral Daily Niel Hummer, NP      . traZODone (DESYREL) tablet 50 mg  50 mg Oral QHS PRN Niel Hummer, NP   50 mg at 04/01/16 2208   PTA Medications: Prescriptions prior to admission  Medication Sig Dispense Refill Last Dose  . busPIRone (BUSPAR) 5 MG tablet Take 1 tablet (5 mg total) by mouth 3 (three) times daily. 90 tablet 0   . citalopram (CELEXA) 20 MG tablet Take 1 tablet (20 mg total) by mouth daily. 30 tablet 0   . gabapentin (NEURONTIN) 100 MG capsule Take 2 capsules (200 mg total) by mouth 3 (three) times daily. 180 capsule 0   .  Multiple Vitamin (MULTIVITAMIN WITH MINERALS) TABS tablet Take 1 tablet by mouth daily.     Marland Kitchen thiamine 100 MG tablet Take 1 tablet (100 mg total) by mouth daily.     Marland Kitchen  traZODone (DESYREL) 50 MG tablet Take 1 tablet (50 mg total) by mouth at bedtime as needed for sleep. 14 tablet 0     Musculoskeletal: Strength & Muscle Tone: within normal limits Gait & Station: normal Patient leans: N/A  Psychiatric Specialty Exam: Physical Exam  Nursing note and vitals reviewed. Constitutional: He is oriented to person, place, and time. He appears well-developed.  Neurological: He is alert and oriented to person, place, and time. No cranial nerve deficit.  Skin: Skin is warm and dry.  Psychiatric: His speech is normal. His mood appears anxious. He is actively hallucinating. He exhibits a depressed mood.    Review of Systems  Psychiatric/Behavioral: Positive for depression, suicidal ideas, hallucinations and substance abuse. The patient is nervous/anxious.   All other systems reviewed and are negative.   Blood pressure 121/82, pulse 92, temperature 98.4 F (36.9 C), temperature source Oral, resp. rate 20, height 5' 10"  (1.778 m), weight 92.534 kg (204 lb), SpO2 99 %.Body mass index is 29.27 kg/(m^2).  General Appearance: Disheveled  Eye Contact:  Fair  Speech:  Normal Rate  Volume:  Normal  Mood:  Anxious and Depressed  Affect:  Congruent  Thought Process:  Goal Directed  Orientation:  Full (Time, Place, and Person)  Thought Content:  Hallucinations: Visual  Suicidal Thoughts:  Yes.  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 Patient admitted for further observation, safety and stabilization. TTS staff to reevaluate in am  Observation Level/Precautions:  Continuous Observation Laboratory:   Psychotherapy:   Medications:   Consultations:   Discharge Concerns:   Estimated LOS: Other:      Chanette Demo E, PA-C 6/14/20177:06 AM

## 2016-04-02 NOTE — [facility] Counselor (Signed)
Pt wants to receive treatment via inpatient. AC (Tori) was made aware of this . [facility] Pt will be further evaluated on this morning to determine final disposition   Ardelle ParkLatoya McNeil, MA OBS Counselor

## 2016-04-02 NOTE — Progress Notes (Signed)
Patient attended N/A group tonight.  

## 2016-04-02 NOTE — Progress Notes (Signed)
Pt reports passive SI thoughts and denies HI. Pt reports that he has been "dreaming crazy stuff." He c/o stomach cramps and was given prn Ibuprofen. Pt has a mild tremor this morning and anxiety. Safety maintained in the OBS unit.

## 2016-04-03 MED ORDER — SERTRALINE HCL 50 MG PO TABS
50.0000 mg | ORAL_TABLET | Freq: Every day | ORAL | Status: DC
  Administered 2016-04-03 – 2016-04-07 (×5): 50 mg via ORAL
  Filled 2016-04-03 (×8): qty 1

## 2016-04-03 MED ORDER — SERTRALINE HCL 50 MG PO TABS: 50.0000 mg | ORAL_TABLET | Freq: Every day | ORAL | Status: DC

## 2016-04-03 NOTE — Progress Notes (Signed)
Adult Psychoeducational Group Note  Date:  04/03/2016 Time:  10:27 PM  Group Topic/Focus:  Wrap-Up Group:   The focus of this group is to help patients review their daily goal of treatment and discuss progress on daily workbooks.  Participation Level:  Active  Participation Quality:  Appropriate  Affect:  Appropriate  Cognitive:  Alert  Insight: Appropriate  Engagement in Group:  Engaged  Modes of Intervention:  Discussion  Additional Comments:  Patient states, "I had a good day". Patient goal for today was to talk to his family.   Shanel Prazak L Iceis Knab 04/03/2016, 10:27 PM

## 2016-04-03 NOTE — [facility] Suicide Risk Assessment (Signed)
[facility]   Nursing information obtained from:  Patient Demographic factors:  Male, Caucasian, Low socioeconomic status, Living alone, Unemployed Current Mental Status:  Suicidal ideation indicated by patient, Suicide plan, Self-harm thoughts, Self-harm behaviors Loss Factors:  Loss of significant relationship, Decline in physical health, Financial problems / change in socioeconomic status Historical Factors:  Prior suicide attempts, Family history of suicide, Family history of mental illness or substance abuse Risk Reduction Factors:  Positive social support  Total Time spent with patient: 30 minutes Principal Problem: Substance induced mood disorder (HCC) Diagnosis:   Patient Active Problem List   Diagnosis Date Noted  . Substance induced mood disorder (HCC) [F19.94] 04/01/2016  . Alcohol use disorder (HCC) [F10.99] 04/01/2016  . Alcohol use disorder, severe, dependence (HCC) [F10.20] 09/10/2015  . Alcohol intoxication (HCC) [F10.129]      Continued Clinical Symptoms:  Alcohol Use Disorder Identification Test Final Score (AUDIT): 38 The "Alcohol Use Disorders Identification Test", Guidelines for Use in Primary Care, Second Edition.  World Science writerHealth Organization [facility](WHO). Score between 0-7:  no or low risk or alcohol related problems. Score between 8-15:  moderate risk of alcohol related problems. Score between 16-19:  high risk of alcohol related problems. Score 20 or above:  warrants further diagnostic evaluation for alcohol dependence and treatment.   CLINICAL FACTORS:   45 year old man , who has history of alcohol dependence, has been drinking up to a case of beer per day, and states that his wife recently kicked him out of the home, so he is now homeless, presented to the ED intoxicated, depressed, reporting suicidal ideations and requesting help for detox      Psychiatric Specialty Exam: Physical Exam  ROS  Blood pressure 135/98, pulse 102,  temperature 98 F (36.7 C), temperature source Oral, resp. rate 16, height 5\' 10"  (1.778 m), weight 204 lb (92.534 kg), SpO2 98 %.Body mass index is 29.27 kg/(m^2).  See admit note MSE   COGNITIVE FEATURES THAT CONTRIBUTE TO RISK:  Closed-mindedness and Loss of executive function    SUICIDE RISK:   Moderate:  Frequent suicidal ideation with limited intensity, and duration, some specificity in terms of plans, no associated intent, good self-control, limited dysphoria/symptomatology, some risk factors present, and identifiable protective factors, including available and accessible social support.  PLAN OF CARE: Patient will be admitted to inpatient psychiatric unit for stabilization and safety. Will provide and encourage milieu participation. Provide medication management and maked adjustments as needed. Will provide medication management to minimize risk of alcohol WDL.  Will follow daily.    I certify that inpatient services furnished can reasonably be expected to improve the patient's condition.   Nehemiah MassedOBOS, FERNANDO, MD 04/03/2016, 4:04 PM

## 2016-04-03 NOTE — Tx Team (Signed)
Interdisciplinary Treatment Plan Update (Adult) Date: 04/03/2016    Time Reviewed: 9:30 AM  Progress in Treatment: Attending groups: Continuing to assess, patient new to milieu Participating in groups: Continuing to assess, patient new to milieu Taking medication as prescribed: Yes Tolerating medication: Yes Family/Significant other contact made: No, CSW assessing for appropriate contacts Patient understands diagnosis: Yes Discussing patient identified problems/goals with staff: Yes Medical problems stabilized or resolved: Yes Denies suicidal/homicidal ideation: Yes Issues/concerns per patient self-inventory: Yes Other:  New problem(s) identified: N/A  Discharge Plan or Barriers: CSW continuing to assess, patient new to milieu.  Reason for Continuation of Hospitalization:  Depression Anxiety Medication Stabilization   Comments: N/A  Estimated length of stay: 3-5 days    Patient is a 45 year old male admitted for substance abuse and SI. Pt reports primary trigger(s) for admission was recent homelessness after being kicked out of home by wife due to his drinking, recent custody loss of daughter, and financial issues. Patient will benefit from crisis stabilization, medication evaluation, group therapy and psycho education in addition to case management for discharge planning. At discharge, it is recommended that Pt remain compliant with established discharge plan and continued treatment.   Review of initial/current patient goals per problem list:  1. Goal(s): Patient will participate in aftercare plan   Met: No   Target date: 3-5 days post admission date   As evidenced by: Patient will participate within aftercare plan AEB aftercare provider and housing plan at discharge being identified.  6/15: Goal not met: CSW assessing for appropriate referrals for pt and will have follow up secured prior to d/c.    2. Goal (s): Patient will exhibit decreased depressive symptoms  and suicidal ideations.   Met: No   Target date: 3-5 days post admission date   As evidenced by: Patient will utilize self rating of depression at 3 or below and demonstrate decreased signs of depression or be deemed stable for discharge by MD.  6/15: Goal not met: Pt presents with flat affect and depressed mood.  Pt admitted with depression rating of 10.  Pt to show decreased sign of depression and a rating of 3 or less before d/c.        4. Goal(s): Patient will demonstrate decreased signs of withdrawal due to substance abuse   Met: No   Target date: 3-5 days post admission date   As evidenced by: Patient will produce a CIWA/COWS score of 0, have stable vitals signs, and no symptoms of withdrawal  6/14: CIWA of 2, experiencing anxiety    5. Goal(s): Patient will demonstrate decreased signs of psychosis  * Met: No  * Target date: 3-5 days post admission date  * As evidenced by: Patient will demonstrate decreased frequency of AVH or return to baseline function   6/15: Goal not met: Pt to take medication as prescribed to decrease psychosis to baseline.      6. Goal (s): Patient will demonstrate decreased signs of mania  * Met:   * Target date: 3-5 days post admission date  * As evidenced by: Patient demonstrate decreased signs of mania AEB decreased mood instability and demonstration of stable mood     Attendees:  Patient:    Family:    Physician: Dr. Parke Poisson, Dr. Kathie Dike MD  04/03/2016   Nursing: Gaylan Gerold, Chalmers Cater, RN 04/03/2016   Clinical Social Worker Peri Maris, [place] 04/03/2016   Other: Tilden Fossa, LCSW; Floydale Smart LCSW 04/03/2016   Clinical:  04/03/2016              Scribe for Treatment Team:  Tilden Fossa, [place]

## 2016-04-03 NOTE — [facility] Group Notes (Signed)
[facility] LCSW Group Therapy 04/03/2016 1:15 PM Type of Therapy: Group Therapy Participation Level: Active  Participation Quality: Attentive, Sharing and Supportive  Affect: Depressed and Flat  Cognitive: Alert and Oriented  Insight: Developing/Improving and Engaged  Engagement in Therapy: Developing/Improving and Engaged  Modes of Intervention: Activity, Clarification, Confrontation, Discussion, Education, Exploration, Limit-setting, Orientation, Problem-solving, Rapport Building, Reality Testing, Socialization and Support  Summary of Progress/Problems: Patient was attentive and engaged with speaker from [employer]. Patient was attentive to speaker while they shared their story of dealing with mental health and overcoming it. Patient expressed interest in their programs and services and received information on their agency. Patient processed ways they can relate to the speaker.   Tiearra Colwell, LCSW Clinical Social Worker [facility] [phone]    

## 2016-04-03 NOTE — Progress Notes (Signed)
D: Patient complains of severe depression and passive SI.  He contracts for safety.  He reports withdrawal symptoms of cravings, agitation, sedation and irritability.  He rates his depression and hopelessness as a 10; anxiety as an 8.  His goal today is to "try my best not to think what brought me."  He refused his celexa this morning stating "it's just not working for me anymore."  He denies HI/AVH.  Patient was engaged during group. A: Continue to monitor medication management and MD orders.  Safety checks completed every 15 minutes per protocol.  Offer support and encouragement as needed. R; Patient receptive to staff; his behavior has been appropriate.

## 2016-04-03 NOTE — Progress Notes (Signed)
D:Patient in the hallway on approach.  Patient got his shoes out his locker without the shoestrings tonight.  Patient states his day was ok.  Patient states his goal os to stop drinking alcohol.  Patient states he wants a better life for himself.  Patient denies SI/HI and denies AVH A: Staff to monitor Q 15 mins for safety.  Encouragement and support offered.  Scheduled medications administered per orders. R: Patient remains safe on the unit.  Patient attended group tonight.  Patient visible on the unit.  Patient taking administered medications.
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
## 2016-04-04 MED ORDER — ACAMPROSATE CALCIUM 333 MG PO TBEC
666.0000 mg | DELAYED_RELEASE_TABLET | Freq: Three times a day (TID) | ORAL | Status: DC
  Administered 2016-04-04 – 2016-04-09 (×15): 666 mg via ORAL
  Filled 2016-04-04 (×19): qty 2

## 2016-04-04 NOTE — Progress Notes (Signed)
Patient did attend the evening speaker AA meeting.  

## 2016-04-04 NOTE — Progress Notes (Signed)
Recreation Therapy Notes  Date: 06.16.2017 Time: 9:30am Location: [address] Group Room   Group Topic: Stress Management  Goal Area(s) Addresses:  Patient will actively participate in stress management techniques presented during session.   Behavioral Response: Engaged, Attentive   Intervention: Stress management techniques  Activity : Deep Breathing and Guided Imagery. LRT provided education, instruction and demonstration on practice of  Deep Breathing and Guided Imagery. Patient was asked to participate in technique introduced during session.   Education:  Stress Management, Discharge Planning.   Education Outcome: Acknowledges education  Clinical Observations/Feedback: Patient actively engaged in technique introduced, expressed no concerns and demonstrated ability to practice independently post d/c.   Marykay Lexenise L Mirriam Vadala, LRT/CTRS        Shy Guallpa L 04/04/2016 10:23 AM

## 2016-04-04 NOTE — Progress Notes (Addendum)
[facility]  04/04/2016 3:17 PM [name] BurrowRobert J [name]  MRN:  [id number] Subjective:   Patient is currently reporting some improvement, still feels depressed, but to a lesser degree than on admission. Regarding withdrawal symptoms, states he feels partially better, but reports some ongoing subjective sense of jitteriness and mild diaphoresis. No tremors, no headache, no visual disturbances . Denies medication side effects. Objective : Case discussed with treatment team and patient seen by me . Patient reports partial improvement in mood , but still feels depressed .  At this time patient is not presenting with any significant symptoms of withdrawal, does not appear to be in any acute distress. No tremor, no acute distress, no psychomotor restlessness, vitals stable, no visual disturbances. States he is continuing to experience some alcohol cravings, and we discussed options . Patient expressed interest in Campral trial. Patient has reported history of intermittent explosiveness , short lived  angry outbursts, which have been aggravated by alcohol consumption but which he feels he still has even when sober - he states these have cost him relationship loss and stress, and problems with employers in the past, we discussed options and he is interested in Depakote ER trial. His AST , ALT however, are elevated  ( 117 )  No disruptive or agitated behaviors on unit, going to some groups . He is going to some groups, more visible in milieu   Principal Problem: Substance induced mood disorder (HCC) Diagnosis:   Patient Active Problem List   Diagnosis Date Noted  . Substance induced mood disorder (HCC) [F19.94] 04/01/2016  . Alcohol use disorder (HCC) [F10.99] 04/01/2016  . Alcohol use disorder, severe, dependence (HCC) [F10.20] 09/10/2015  . Alcohol intoxication (HCC) [F10.129]    Total Time spent with patient: 20 minutes    Past Medical History:  Past Medical History  Diagnosis Date  .  Cirrhosis of liver not due to alcohol (HCC)   . Depression   . Anxiety   . Bipolar affect, depressed (HCC)     Past Surgical History  Procedure Laterality Date  . Hernia repair     Family History: History reviewed. No pertinent family history.  Social History:  History  Alcohol Use  . Yes    Comment: case and 1/2 daily of beer (30 to 40 beers daily)     History  Drug Use No    Social History   Social History  . Marital Status: Single    Spouse Name: N/A  . Number of Children: N/A  . Years of Education: N/A   Social History Main Topics  . Smoking status: Current Every Day Smoker -- 1.50 packs/day  . Smokeless tobacco: None  . Alcohol Use: Yes     Comment: case and 1/2 daily of beer (30 to 40 beers daily)  . Drug Use: No  . Sexual Activity: Not Asked   Other Topics Concern  . None   Social History Narrative   Additional Social History:   Sleep: improved   Appetite:  Good  Current Medications: Current Facility-Administered Medications  Medication Dose Route Frequency Provider Last Rate Last Dose  . alum & mag hydroxide-simeth (MAALOX/MYLANTA) 200-200-20 MG/5ML suspension 30 mL  30 mL Oral Q4H PRN Thermon LeylandLaura A Davis, NP      . busPIRone (BUSPAR) tablet 5 mg  5 mg Oral BID Thermon LeylandLaura A Davis, NP   5 mg at 04/04/16 0802  . gabapentin (NEURONTIN) capsule 100 mg  100 mg Oral TID Thermon LeylandLaura A Davis, NP  100 mg at 04/04/16 1154  . hydrOXYzine (ATARAX/VISTARIL) tablet 25 mg  25 mg Oral Q6H PRN Thermon Leyland, NP      . ibuprofen (ADVIL,MOTRIN) tablet 400 mg  400 mg Oral Q6H PRN Thermon Leyland, NP   400 mg at 04/02/16 1610  . loperamide (IMODIUM) capsule 2-4 mg  2-4 mg Oral PRN Thermon Leyland, NP   4 mg at 04/01/16 1801  . LORazepam (ATIVAN) tablet 1 mg  1 mg Oral Q6H PRN Thermon Leyland, NP   1 mg at 04/03/16 2154  . LORazepam (ATIVAN) tablet 1 mg  1 mg Oral BID Thermon Leyland, NP   1 mg at 04/04/16 0805   Followed by  . [START ON 04/05/2016] LORazepam (ATIVAN) tablet 1 mg  1 mg Oral Daily  Thermon Leyland, NP      . magnesium hydroxide (MILK OF MAGNESIA) suspension 30 mL  30 mL Oral Daily PRN Thermon Leyland, NP      . multivitamin with minerals tablet 1 tablet  1 tablet Oral Daily Thermon Leyland, NP   1 tablet at 04/04/16 0802  . nicotine (NICODERM CQ - dosed in mg/24 hours) patch 21 mg  21 mg Transdermal Daily Thermon Leyland, NP   21 mg at 04/04/16 0802  . ondansetron (ZOFRAN-ODT) disintegrating tablet 4 mg  4 mg Oral Q6H PRN Thermon Leyland, NP      . sertraline (ZOLOFT) tablet 50 mg  50 mg Oral Daily Craige Cotta, MD   50 mg at 04/04/16 0805  . thiamine (VITAMIN B-1) tablet 100 mg  100 mg Oral Daily Thermon Leyland, NP   100 mg at 04/04/16 0802  . traZODone (DESYREL) tablet 50 mg  50 mg Oral QHS PRN Thermon Leyland, NP   50 mg at 04/03/16 2154    Lab Results: No results found for this or any previous visit (from the past 48 hour(s)).  Blood Alcohol level:  Lab Results  Component Value Date   ETH 115* 04/01/2016   ETH 241* 04/01/2016    Physical Findings: AIMS: Facial and Oral Movements Muscles of Facial Expression: None, normal Lips and Perioral Area: None, normal Jaw: None, normal Tongue: None, normal,Extremity Movements Upper (arms, wrists, hands, fingers): None, normal Lower (legs, knees, ankles, toes): None, normal, Trunk Movements Neck, shoulders, hips: None, normal, Overall Severity Severity of abnormal movements (highest score from questions above): None, normal Incapacitation due to abnormal movements: None, normal Patient's awareness of abnormal movements (rate only patient's report): No Awareness, Dental Status Current problems with teeth and/or dentures?: Yes Does patient usually wear dentures?: No  CIWA:  CIWA-Ar Total: 2 COWS:     Musculoskeletal: Strength & Muscle Tone: within normal limits Gait & Station: normal Patient leans: Right  Psychiatric Specialty Exam: Physical Exam  ROS denies headache, denies chest pain, denies shortness of breath    Blood pressure 137/82, pulse 94, temperature 98.1 F (36.7 C), temperature source Oral, resp. rate 18, height  (1.778 m), weight 204 lb (92.534 kg), SpO2 98 %.Body mass index is 29.27 kg/(m^2).  General Appearance: Fairly Groomed  Eye Contact:  Good  Speech:  Normal Rate  Volume:  Normal  Mood:  depressed but improving   Affect:  constricted,but smiles at times appropriately   Thought Process:  Linear  Orientation:  Full (Time, Place, and Person)  Thought Content:  denies hallucinations, no delusions, not internally preoccupied   Suicidal Thoughts:  No- denies  suicidal or self injurious ideations at this time and contracts for safety on the unit   Homicidal Thoughts:  No- denies homicidal ideations  Memory:  recent and remote grossly intact   Judgement:  Other:  improving   Insight:  improving   Psychomotor Activity:  Normal  Concentration:  Concentration: Good and Attention Span: Good  Recall:  Good  Fund of Knowledge:  Good  Language:  Good  Akathisia:  Negative  Handed:  Right  AIMS (if indicated):     Assets:  Desire for Improvement Resilience  ADL's:  Intact  Cognition:  WNL  Sleep:  Number of Hours: 6   Assessment - reports some residual symptoms of alcohol WDL but does not present in any acute distress or discomfort, and at present vitals are stable. Mood remains depressed, but is improving and denies any suicidal ideations at this time. Interested in Campral for alcohol cravings. Depakote ER trial may be helpful to address history of intermittent explosiveness, but due to elevated LFTs are repeating hepatic function panel to document improvement of transaminase level prior to starting Valproic Acid .   Treatment Plan Summary: Daily contact with patient to assess and evaluate symptoms and progress in treatment, Medication management, Plan inpatient treatment  and medications as below Continue to encourage group and milieu participation to work on coping skills and  symptom reduction  Continue to encourage efforts to work on early recovery and relapse prevention efforts  Continue Ativan detox protocol to minimize risk of alcohol WDL Start Campral 666 mgrs TID for alcohol cravings  Continue Zoloft 50 mgrs QDAY for depression and anxiety Continue Trazodone 50 mgrs QHS PRN for insomnia  Monitor /follow hepatic function tests due to elevated transaminases 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 Nehemiah Massed, MD 04/04/2016, 3:17 PM

## 2016-04-04 NOTE — Progress Notes (Signed)
Patient ID: [name] BurrowRobert J [name], male   DOB: 09/01/1971, 45 y.o.   MRN: [id number] D: Patient in dayroom watching TV and interacting well with peers. Pt reports tolerating medication well. Pt c/o tremors and anxiety. Denies  SI/HI/AVH and pain.No acute physical distress noted.  A: Medications administered as prescribed. Emotional support given and will continue to monitor pt's progress for stabilization. R: Patient remains safe and complaint with medications. Pt attended evening AA group.

## 2016-04-04 NOTE — Progress Notes (Signed)
Patient up and visible in milieu. Affect flat, mood depressed. Rates his depression at a 5/10, hopelessness at a 6/10 and anxiety at a 4/10. States sleep and concentration are both "good", appetite "fair" and energy "normal." Reports his goal for today is to "learn as much as possible on how to live a sober life."   Medicated per orders, education provided. No prn's requested or required. Emotional support offered. Self inventory reviewed.   Patient remains safe on level III obs. Denies SI/HI.

## 2016-04-04 NOTE — Plan of Care (Signed)
Problem: Health Behavior/Discharge Planning: Goal: Compliance with treatment plan for underlying cause of condition will improve Outcome: Progressing Patient is med compliant, participating in group therapy.  Problem: Safety: Goal: Periods of time without injury will increase Outcome: Progressing `Patient has not engaged in self harm.

## 2016-04-04 NOTE — [facility] Group Notes (Signed)
[facility] LCSW Group Therapy  04/04/2016 1:30 PM  Type of Therapy:  Group Therapy  Participation Level:  Did Not Attend-pt chose to rest in room.   Modes of Intervention:  Confrontation, Discussion, Education, Exploration, Problem-solving, Rapport Building, Socialization and Support  Summary of Progress/Problems: Feelings around Relapse. Group members discussed the meaning of relapse and shared personal stories of relapse, how it affected them and others, and how they perceived themselves during this time. Group members were encouraged to identify triggers, warning signs and coping skills used when facing the possibility of relapse. Social supports were discussed and explored in detail. Post Acute Withdrawal Syndrome (handout provided) was introduced and examined. Pt's were encouraged to ask questions, talk about key points associated with PAWS, and process this information in terms of relapse prevention.   Smart, [name] Torelli LCSW 04/04/2016, 1:30 PM

## 2016-04-05 DIAGNOSIS — F1099 Alcohol use, unspecified with unspecified alcohol-induced disorder: Secondary | ICD-10-CM

## 2016-04-05 DIAGNOSIS — F1994 Other psychoactive substance use, unspecified with psychoactive substance-induced mood disorder: Secondary | ICD-10-CM

## 2016-04-05 LAB — HEPATIC FUNCTION PANEL
ALBUMIN: 4.3 g/dL (ref 3.5–5.0)
ALT: 123 U/L — AB (ref 17–63)
AST: 121 U/L — AB (ref 15–41)
Alkaline Phosphatase: 64 U/L (ref 38–126)
BILIRUBIN TOTAL: 0.5 mg/dL (ref 0.3–1.2)
Bilirubin, Direct: 0.1 mg/dL (ref 0.1–0.5)
Indirect Bilirubin: 0.4 mg/dL (ref 0.3–0.9)
Total Protein: 7.9 g/dL (ref 6.5–8.1)

## 2016-04-05 MED ORDER — HYDROXYZINE HCL 50 MG PO TABS
50.0000 mg | ORAL_TABLET | Freq: Four times a day (QID) | ORAL | Status: DC | PRN
  Administered 2016-04-05 – 2016-04-07 (×5): 50 mg via ORAL
  Filled 2016-04-05: qty 1
  Filled 2016-04-05: qty 20
  Filled 2016-04-05 (×5): qty 1

## 2016-04-05 MED ORDER — TRAZODONE HCL 50 MG PO TABS
50.0000 mg | ORAL_TABLET | Freq: Once | ORAL | Status: AC | PRN
  Administered 2016-04-05: 50 mg via ORAL
  Filled 2016-04-05: qty 1

## 2016-04-05 NOTE — Progress Notes (Signed)
Patient did attend the evening speaker AA meeting.  

## 2016-04-05 NOTE — Progress Notes (Signed)
D: Patient denies SI/HI and A/V hallucinations; patient reports sleep is fair; reports appetite is good; reports energy level is normal ; reports ability to concentrate is good; rates depression as 4/10; rates hopelessness 3/10; rates anxiety as 4/10; patient has been reporting anxiety and irritability  A: Monitored q 15 minutes; patient encouraged to attend groups; patient educated about medications; patient given medications per physician orders; patient encouraged to express feelings and/or concerns  R: Patient is pleasant but obviously anxious and irritable; patient's interaction with staff and peers has been minimal; patient has been trying to stay quiet and keep to himself; patient was able to set goal to talk with staff 1:1 when having feelings of SI; patient is taking medications as prescribed and tolerating medications; patient is attending all groups

## 2016-04-05 NOTE — [facility] Group Notes (Signed)
[facility] Group Notes: (Clinical Social Work)   04/05/2016      Type of Therapy:  Group Therapy   Participation Level:  Did Not Attend despite [facility] prompting   [name] MantleMareida Grossman-Orr, LCSW 04/05/2016, 11:28 AM

## 2016-04-05 NOTE — Progress Notes (Signed)
Patient ID: [name] [name], male   DOB: [birth year], 45 y.o.   MRN: [id number] [facility] MD Progress Note  04/05/2016 10:26 AM [name] [name]  MRN:  [id number] Subjective:   Patient is currently reporting some improvement, less depressed. Regarding withdrawal symptoms, states he feels partially better No tremors, no headache, no visual disturbances . Denies medication side effects. Objective : Case discussed with treatment team and patient seen by me . Patient reports partial improvement in mood. .  At this time patient is not presenting with any significant symptoms of withdrawal, does not appear to be in any acute distress. No tremor, no acute distress, no psychomotor restlessness, vitals stable, no visual disturbances. States he is continuing to experience some alcohol cravings, and we discussed options . Patient started on camprall. Managing withdrawals Patient has reported history of intermittent explosiveness , short lived  angry outbursts, which have been aggravated by alcohol consumption but which he feels he still has even when sober - he states these have cost him relationship loss and stress, and problems with employers in the past, we discussed options and he is interested in Depakote ER trial. His AST , ALT however, are elevated  ( 117 ) . Will hold off from depakote as of now. No disruptive or agitated behaviors on unit, going to some groups . He is going to some groups, more visible in milieu   Principal Problem: Substance induced mood disorder (HCC) Diagnosis:   Patient Active Problem List   Diagnosis Date Noted  . Substance induced mood disorder (HCC) [F19.94] 04/01/2016  . Alcohol use disorder (HCC) [F10.99] 04/01/2016  . Alcohol use disorder, severe, dependence (HCC) [F10.20] 09/10/2015  . Alcohol intoxication (HCC) [F10.129]    Total Time spent with patient: 20 minutes    Past Medical History:  Past Medical History  Diagnosis Date  . Cirrhosis of liver not due to  alcohol (HCC)   . Depression   . Anxiety   . Bipolar affect, depressed (HCC)     Past Surgical History  Procedure Laterality Date  . Hernia repair     Family History: History reviewed. No pertinent family history.  Social History:  History  Alcohol Use  . Yes    Comment: case and 1/2 daily of beer (30 to 40 beers daily)     History  Drug Use No    Social History   Social History  . Marital Status: Single    Spouse Name: N/A  . Number of Children: N/A  . Years of Education: N/A   Social History Main Topics  . Smoking status: Current Every Day Smoker -- 1.50 packs/day  . Smokeless tobacco: None  . Alcohol Use: Yes     Comment: case and 1/2 daily of beer (30 to 40 beers daily)  . Drug Use: No  . Sexual Activity: Not Asked   Other Topics Concern  . None   Social History Narrative   Additional Social History:   Sleep: improved   Appetite:  Good  Current Medications: Current Facility-Administered Medications  Medication Dose Route Frequency Provider Last Rate Last Dose  . acamprosate (CAMPRAL) tablet 666 mg  666 mg Oral TID WC [name] Cotta, MD   666 mg at 04/05/16 0643  . alum & mag hydroxide-simeth (MAALOX/MYLANTA) 200-200-20 MG/5ML suspension 30 mL  30 mL Oral Q4H PRN [name] Leyland, NP   30 mL at 04/05/16 0348  . busPIRone (BUSPAR) tablet 5 mg  5 mg Oral BID [name] Apley  Earlene Plateravis, NP   5 mg at 04/05/16 0746  . gabapentin (NEURONTIN) capsule 100 mg  100 mg Oral TID [name] LeylandLaura A Davis, NP   100 mg at 04/05/16 0746  . ibuprofen (ADVIL,MOTRIN) tablet 400 mg  400 mg Oral Q6H PRN [name] LeylandLaura A Davis, NP   400 mg at 04/02/16 16100812  . magnesium hydroxide (MILK OF MAGNESIA) suspension 30 mL  30 mL Oral Daily PRN [name] LeylandLaura A Davis, NP      . multivitamin with minerals tablet 1 tablet  1 tablet Oral Daily [name] LeylandLaura A Davis, NP   1 tablet at 04/05/16 0747  . nicotine (NICODERM CQ - dosed in mg/24 hours) patch 21 mg  21 mg Transdermal Daily [name] LeylandLaura A Davis, NP   21 mg at 04/05/16 0748  . sertraline  (ZOLOFT) tablet 50 mg  50 mg Oral Daily [name] CottaFernando A Cobos, MD   50 mg at 04/05/16 0747  . thiamine (VITAMIN B-1) tablet 100 mg  100 mg Oral Daily [name] LeylandLaura A Davis, NP   100 mg at 04/05/16 0746  . traZODone (DESYREL) tablet 50 mg  50 mg Oral QHS PRN [name] LeylandLaura A Davis, NP   50 mg at 04/04/16 2127    Lab Results:  Results for orders placed or performed during the hospital encounter of 04/01/16 (from the past 48 hour(s))  Hepatic function panel     Status: Abnormal   Collection Time: 04/05/16  6:20 AM  Result Value Ref Range   Total Protein 7.9 6.5 - 8.1 g/dL   Albumin 4.3 3.5 - 5.0 g/dL   AST 960121 (H) 15 - 41 U/L   ALT 123 (H) 17 - 63 U/L   Alkaline Phosphatase 64 38 - 126 U/L   Total Bilirubin 0.5 0.3 - 1.2 mg/dL   Bilirubin, Direct 0.1 0.1 - 0.5 mg/dL   Indirect Bilirubin 0.4 0.3 - 0.9 mg/dL    Comment: Performed at [facility]    Blood Alcohol level:  Lab Results  Component Value Date   [facility] 04/01/2016   ETH 241* 04/01/2016    Physical Findings: AIMS: Facial and Oral Movements Muscles of Facial Expression: None, normal Lips and Perioral Area: None, normal Jaw: None, normal Tongue: None, normal,Extremity Movements Upper (arms, wrists, hands, fingers): None, normal Lower (legs, knees, ankles, toes): None, normal, Trunk Movements Neck, shoulders, hips: None, normal, Overall Severity Severity of abnormal movements (highest score from questions above): None, normal Incapacitation due to abnormal movements: None, normal Patient's awareness of abnormal movements (rate only patient's report): No Awareness, Dental Status Current problems with teeth and/or dentures?: Yes Does patient usually wear dentures?: No  CIWA:  CIWA-Ar Total: 1 COWS:     Musculoskeletal: Strength & Muscle Tone: within normal limits Gait & Station: normal Patient leans: Right  Psychiatric Specialty Exam: Physical Exam  Review of Systems  Cardiovascular: Negative for chest pain.   Neurological: Negative for tremors.  Psychiatric/Behavioral: Negative for suicidal ideas.   denies headache, denies chest pain, denies shortness of breath   Blood pressure 124/93, pulse 115, temperature 97.9 F (36.6 C), temperature source Oral, resp. rate 20, height 5\' 10"  (1.778 m), weight 92.534 kg (204 lb), SpO2 98 %.Body mass index is 29.27 kg/(m^2).  General Appearance: Fairly Groomed  Eye Contact:  Good  Speech:  Normal Rate  Volume:  Normal  Mood:  depressed but improving   Affect:  constricted,but smiles at times appropriately   Thought Process:  Linear  Orientation:  Full (Time, Place, and Person)  Thought Content:  denies hallucinations, no delusions, not internally preoccupied   Suicidal Thoughts:  No- denies suicidal or self injurious ideations at this time and contracts for safety on the unit   Homicidal Thoughts:  No- denies homicidal ideations  Memory:  recent and remote grossly intact   Judgement:  Other:  improving   Insight:  improving   Psychomotor Activity:  Normal  Concentration:  Concentration: Good and Attention Span: Good  Recall:  Good  Fund of Knowledge:  Good  Language:  Good  Akathisia:  Negative  Handed:  Right  AIMS (if indicated):     Assets:  Desire for Improvement Resilience  ADL's:  Intact  Cognition:  WNL  Sleep:  Number of Hours: 6   Assessment - residual withdrawal symptoms. Hold off depakote for now due to raised liver function. Monitor behaviour.    Treatment Plan Summary: Daily contact with patient to assess and evaluate symptoms and progress in treatment, Medication management, Plan inpatient treatment  and medications as below Continue to encourage group and milieu participation to work on coping skills and symptom reduction  Continue to encourage efforts to work on early recovery and relapse prevention efforts  Continue Ativan detox protocol to minimize risk of alcohol WDL Continue  Campral 666 mgrs TID for alcohol cravings   Continue Zoloft 50 mgrs QDAY for depression and anxiety Continue Trazodone 50 mgrs QHS PRN for insomnia  Monitor /follow hepatic function tests due to elevated transaminases 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 Thresa Ross, MD 04/05/2016, 10:26 AM

## 2016-04-06 ENCOUNTER — Encounter (HOSPITAL_COMMUNITY): Payer: Self-pay | Admitting: Registered Nurse

## 2016-04-06 LAB — COMPREHENSIVE METABOLIC PANEL
ALT: 119 U/L — ABNORMAL HIGH (ref 17–63)
ANION GAP: 6 (ref 5–15)
AST: 104 U/L — ABNORMAL HIGH (ref 15–41)
Albumin: 4.1 g/dL (ref 3.5–5.0)
Alkaline Phosphatase: 61 U/L (ref 38–126)
BUN: 11 mg/dL (ref 6–20)
CALCIUM: 9 mg/dL (ref 8.9–10.3)
CHLORIDE: 106 mmol/L (ref 101–111)
CO2: 23 mmol/L (ref 22–32)
Creatinine, Ser: 0.68 mg/dL (ref 0.61–1.24)
GFR calc non Af Amer: >60 mL/min (ref >60)
Glucose, Bld: 282 mg/dL — ABNORMAL HIGH (ref 65–99)
POTASSIUM: 4.1 mmol/L (ref 3.5–5.1)
SODIUM: 135 mmol/L (ref 135–145)
Total Bilirubin: 0.7 mg/dL (ref 0.3–1.2)
Total Protein: 7.4 g/dL (ref 6.5–8.1)

## 2016-04-06 MED ORDER — GABAPENTIN 100 MG PO CAPS
200.0000 mg | ORAL_CAPSULE | Freq: Three times a day (TID) | ORAL | Status: DC
  Administered 2016-04-06 – 2016-04-08 (×8): 200 mg via ORAL
  Filled 2016-04-06 (×12): qty 2

## 2016-04-06 NOTE — Progress Notes (Signed)
D: C/O sadness this evening stating "it's fathers day and I can't see my daughter."  Denies physical complaints. Originally didn't want to attend AA group due to decreased energy. Affect appropriate and blunted, mood "sad."Denies SI, HI, and AVH. Marland Kitchen.  Awake with peers after AA group watching a movie. C/O insomnia tonight after receiving scheduled sleep medicine- nothing ordered PRN and it was too early for Vistaril.  Blood sugar on CMP from 1800 was 282, patient denies diabetes, but disclosed is father was type 2 diabetic  A: Spoke with patient regarding blood sugar, will get fasting blood sugar in AM and leave a note for provider to follow up.  Notified patient he can receive a PRN vistaril around 0200 if he is awake. Continued on 15 minute checks for safety.  R: Patient appeared to be asleep by midnight on 15 minute checks.  Remains safe on unit.

## 2016-04-06 NOTE — Plan of Care (Signed)
Problem: Medication: Goal: Compliance with prescribed medication regimen will improve Outcome: Progressing Pt compliant with medication regime. Pt denies SI.

## 2016-04-06 NOTE — [facility] Group Notes (Deleted)
[facility] LCSW Group Therapy  04/06/2016 10:10 until 11 AM  Type of Therapy:  Group Therapy  Participation Level:  Did Not Attend although encouraged by MHT to do so   Carney BernCatherine C Harrill, LCSW  04/06/2016

## 2016-04-06 NOTE — Progress Notes (Signed)
  NSG 7a-7p shift:   D:  Pt. Has been pleasant this shift but endorses depression because of it being Father's Day and he is not able to be with his 47 year old daughter.  He reported that he was able to talk with her earlier today and that it went well.  He states that he feels ready to make a change and is [facility] but is currently awaiting a bed assignment.      A: Support, education, and encouragement provided as needed.  Level 3 checks continued for safety.  R: Pt.  receptive to intervention/s.  Safety maintained.  Joaquin MusicMary Catha Ontko, RN

## 2016-04-06 NOTE — [facility] Group Notes (Signed)
[facility] LCSW Group Therapy  04/06/2016 10:10 until 11 AM  Type of Therapy:  Group Therapy  Participation Level:  Active  Participation Quality:  Attentive  Affect:  Depressed  Cognitive:  Alert and Oriented  Insight:  Developing/Improving  Engagement in Therapy:  Developing/Improving  Modes of Intervention:  Clarification, Exploration, Orientation, Problem-solving, Rapport Building, Socialization and Support  Summary of Progress/Problems:  Patient shared during warm up that a sign of improvement for her/him would be getting stable by taking my meds verses self medication with alcohol and other substances. The main focus of today's process group was to meet the patient where they are and identify the patient's basic belief as to whether he/she has the opportunity to get better. For those patient's who had no hope other's offered encouragement. Patient expressed hope for self but is frustrated with lack of supports. Others offered support.    [name] BernCatherine C Harrill, LCSW  04/06/2016

## 2016-04-06 NOTE — Progress Notes (Signed)
Patient ID: [name] BurrowRobert J Haugen, male   DOB: 06/17/1971, 45 y.o.   MRN: [id number] D: Patient in dayroom watching TV and interacting well with peers. Pt reports tolerating medication well. Pt c/o anxiety. Denies  SI/HI/AVH and pain.No acute physical distress noted.  A: Medications administered as prescribed. Emotional support given and will continue to monitor pt's progress for stabilization. R: Patient remains safe and complaint with medications. Pt attended evening AA group.

## 2016-04-06 NOTE — Progress Notes (Signed)
Patient did attend the evening speaker AA meeting.  

## 2016-04-06 NOTE — Progress Notes (Signed)
Patient ID: [name] BurrowRobert J [name], male   DOB: 02/25/1971, 45 y.o.   MRN: [id number] [facility]  04/06/2016 2:59 PM [name] [name]  MRN:  [id number]    Subjective:  "I slept pretty good last night"  Assessment: Objective::  Patient seen by this provider and chart reviewed 04/06/2016.  On evaluation:  [name] [name] reports that he is feeling better rates depression 5/10 and anxiety 5/10 (0/none and 10/worse).  Patient states that he is sleeping/eating without difficulty, attending/participating in group sessions; and tolerating medications without adverse reaction.  Continues to have some agitation related to withdrawal.       Principal Problem: Substance induced mood disorder (HCC) Diagnosis:   Patient Active Problem List   Diagnosis Date Noted  . Substance induced mood disorder (HCC) [F19.94] 04/01/2016  . Alcohol use disorder (HCC) [F10.99] 04/01/2016  . Alcohol use disorder, severe, dependence (HCC) [F10.20] 09/10/2015  . Alcohol intoxication (HCC) [F10.129]    Total Time spent with patient: 15 minutes    Past Medical History:  Past Medical History  Diagnosis Date  . Cirrhosis of liver not due to alcohol (HCC)   . Depression   . Anxiety   . Bipolar affect, depressed (HCC)     Past Surgical History  Procedure Laterality Date  . Hernia repair     Family History: History reviewed. No pertinent family history.  Social History:  History  Alcohol Use  . Yes    Comment: case and 1/2 daily of beer (30 to 40 beers daily)     History  Drug Use No    Social History   Social History  . Marital Status: Single    Spouse Name: N/A  . Number of Children: N/A  . Years of Education: N/A   Social History Main Topics  . Smoking status: Current Every Day Smoker -- 1.50 packs/day  . Smokeless tobacco: None  . Alcohol Use: Yes     Comment: case and 1/2 daily of beer (30 to 40 beers daily)  . Drug Use: No  . Sexual Activity: Not Asked   Other Topics Concern  .  None   Social History Narrative   Additional Social History:   Sleep: improved   Appetite:  Good  Current Medications: Current Facility-Administered Medications  Medication Dose Route Frequency Provider Last Rate Last Dose  . acamprosate (CAMPRAL) tablet 666 mg  666 mg Oral TID WC Craige CottaFernando A Cobos, MD   666 mg at 04/06/16 1155  . alum & mag hydroxide-simeth (MAALOX/MYLANTA) 200-200-20 MG/5ML suspension 30 mL  30 mL Oral Q4H PRN Thermon LeylandLaura A Davis, NP   30 mL at 04/05/16 0348  . busPIRone (BUSPAR) tablet 5 mg  5 mg Oral BID Thermon LeylandLaura A Davis, NP   5 mg at 04/06/16 0813  . gabapentin (NEURONTIN) capsule 200 mg  200 mg Oral TID Shuvon B Rankin, NP   200 mg at 04/06/16 1154  . hydrOXYzine (ATARAX/VISTARIL) tablet 50 mg  50 mg Oral Q6H PRN Oneta Rackanika N Lewis, NP   50 mg at 04/05/16 1812  . ibuprofen (ADVIL,MOTRIN) tablet 400 mg  400 mg Oral Q6H PRN Thermon LeylandLaura A Davis, NP   400 mg at 04/02/16 [id number]  . magnesium hydroxide (MILK OF MAGNESIA) suspension 30 mL  30 mL Oral Daily PRN Thermon LeylandLaura A Davis, NP      . multivitamin with minerals tablet 1 tablet  1 tablet Oral Daily Thermon LeylandLaura A Davis, NP   1 tablet at 04/06/16 [phone]  .  nicotine (NICODERM CQ - dosed in mg/24 hours) patch 21 mg  21 mg Transdermal Daily Thermon Leyland, NP   21 mg at 04/06/16 0815  . sertraline (ZOLOFT) tablet 50 mg  50 mg Oral Daily Craige Cotta, MD   50 mg at 04/06/16 0813  . thiamine (VITAMIN B-1) tablet 100 mg  100 mg Oral Daily Thermon Leyland, NP   100 mg at 04/06/16 0813  . traZODone (DESYREL) tablet 50 mg  50 mg Oral QHS PRN Thermon Leyland, NP   50 mg at 04/05/16 2145    Lab Results:  Results for orders placed or performed during the hospital encounter of 04/01/16 (from the past 48 hour(s))  Hepatic function panel     Status: Abnormal   Collection Time: 04/05/16  6:20 AM  Result Value Ref Range   Total Protein 7.9 6.5 - 8.1 g/dL   Albumin 4.3 3.5 - 5.0 g/dL   AST 161 (H) 15 - 41 U/L   ALT 123 (H) 17 - 63 U/L   Alkaline Phosphatase 64 38 -  126 U/L   Total Bilirubin 0.5 0.3 - 1.2 mg/dL   Bilirubin, Direct 0.1 0.1 - 0.5 mg/dL   Indirect Bilirubin 0.4 0.3 - 0.9 mg/dL    Comment: Performed at [facility]    Blood Alcohol level:  Lab Results  Component Value Date   [facility] 115* 04/01/2016   ETH 241* 04/01/2016    Physical Findings: AIMS: Facial and Oral Movements Muscles of Facial Expression: None, normal Lips and Perioral Area: None, normal Jaw: None, normal Tongue: None, normal,Extremity Movements Upper (arms, wrists, hands, fingers): None, normal Lower (legs, knees, ankles, toes): None, normal, Trunk Movements Neck, shoulders, hips: None, normal, Overall Severity Severity of abnormal movements (highest score from questions above): None, normal Incapacitation due to abnormal movements: None, normal Patient's awareness of abnormal movements (rate only patient's report): No Awareness, Dental Status Current problems with teeth and/or dentures?: Yes Does patient usually wear dentures?: No  CIWA:  CIWA-Ar Total: 0 COWS:     Musculoskeletal: Strength & Muscle Tone: within normal limits Gait & Station: normal Patient leans: Right  Psychiatric Specialty Exam: Physical Exam  Review of Systems  Cardiovascular: Negative for chest pain.  Neurological: Negative for tremors.  Psychiatric/Behavioral: Negative for suicidal ideas.   denies headache, denies chest pain, denies shortness of breath   Blood pressure 113/79, pulse 95, temperature 98.5 F (36.9 C), temperature source Oral, resp. rate 20, height 5\' 10"  (1.778 m), weight 92.534 kg (204 lb), SpO2 98 %.Body mass index is 29.27 kg/(m^2).  General Appearance: Fairly Groomed  Eye Contact:  Good  Speech:  Normal Rate  Volume:  Normal  Mood:  depressed but improving   Affect:  constricted,but smiles at times appropriately   Thought Process:  Linear  Orientation:  Full (Time, Place, and Person)  Thought Content:  denies hallucinations, no delusions, not  internally preoccupied   Suicidal Thoughts:  No- denies suicidal or self injurious ideations at this time and contracts for safety on the unit   Homicidal Thoughts:  No- denies homicidal ideations  Memory:  recent and remote grossly intact   Judgement:  Other:  improving   Insight:  improving   Psychomotor Activity:  Normal  Concentration:  Concentration: Good and Attention Span: Good  Recall:  Good  Fund of Knowledge:  Good  Language:  Good  Akathisia:  Negative  Handed:  Right  AIMS (if indicated):  Assets:  Desire for Improvement Resilience  ADL's:  Intact  Cognition:  WNL  Sleep:  Number of Hours: 5   Assessment - Discussed starting Depakote.  Ordered repeat of CMP to assess hepatic function prior to starting.    Treatment Plan Summary: Daily contact with patient to assess and evaluate symptoms and progress in treatment, Medication management, Plan inpatient treatment  and medications as below Continue to encourage group and milieu participation to work on coping skills and symptom reduction  Continue to encourage efforts to work on early recovery and relapse prevention efforts  Continue Ativan detox protocol to minimize risk of alcohol WDL Continue  Campral 666 mgrs TID for alcohol cravings  Continue Zoloft 50 mgrs QDAY for depression and anxiety Continue Trazodone 50 mgrs QHS PRN for insomnia  Increase Neurontin to 200 mg Tid for agitation/anxiety/neuropathic pain  Monitor /follow hepatic function tests due to elevated transaminases 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 Ordered CMP  Patient continues to improve;   Rankin, Shuvon, NP 04/06/2016, 2:59 PM I agreed with findings and treatment plan of this patient

## 2016-04-07 LAB — GLUCOSE, CAPILLARY: GLUCOSE-CAPILLARY: 99 mg/dL (ref 65–99)

## 2016-04-07 MED ORDER — FLUOXETINE HCL 10 MG PO CAPS
10.0000 mg | ORAL_CAPSULE | Freq: Every day | ORAL | Status: DC
  Administered 2016-04-08 – 2016-04-09 (×2): 10 mg via ORAL
  Filled 2016-04-07 (×3): qty 1

## 2016-04-07 NOTE — Progress Notes (Signed)
Recreation Therapy Notes  Date: 06.19.2017 Time: 9:30am Location: [address] Group Room   Group Topic: Stress Management  Goal Area(s) Addresses:  Patient will actively participate in stress management techniques presented during session.   Behavioral Response: Engaged, Attentive   Intervention: Stress management techniques  Activity :  Deep Breathing, Progressive Body Relaxation and Progressive Body Scan. LRT provided education, instruction and demonstration on practice of Deep Breathing, Progressive Body Relaxation and Progressive Body Scan. Patient was asked to participate in technique introduced during session.   Education:  Stress Management, Discharge Planning.   Education Outcome: Acknowledges education  Clinical Observations/Feedback: Patient actively engaged in technique introduced, expressed no concerns and demonstrated ability to practice independently post d/c.   Marykay Lexenise L Deagen Krass, LRT/CTRS        Jaiana Sheffer L 04/07/2016 10:19 AM

## 2016-04-07 NOTE — [facility] Group Notes (Signed)
[facility] LCSW Group Therapy 04/07/2016  1:15 pm  Type of Therapy: Group Therapy Participation Level: Active  Participation Quality: Attentive, Sharing and Supportive  Affect: Blunted  Cognitive: Alert and Oriented  Insight: Developing/Improving and Engaged  Engagement in Therapy: Developing/Improving and Engaged  Modes of Intervention: Clarification, Confrontation, Discussion, Education, Exploration,  Limit-setting, Orientation, Problem-solving, Rapport Building, Dance movement psychotherapisteality Testing, Socialization and Support  Summary of Progress/Problems: Pt identified obstacles faced currently and processed barriers involved in overcoming these obstacles. Pt identified steps necessary for overcoming these obstacles and explored motivation (internal and external) for facing these difficulties head on. Pt further identified one area of concern in their lives and chose a goal to focus on for today. Patient identified his anger as negatively affecting his relationship with his girlfriend and acknowledges that it is exhaserbated by alcohol use. He shares that he would like to start attending AA again and is interested in residential treatment at discharge.   [name] BruinKristin Daryn Hicks, LCSW Clinical Social Worker [facility] [phone]

## 2016-04-07 NOTE — [facility] Group Notes (Signed)
Pt attended spiritual care group on grief and loss facilitated by chaplain Kortny Lirette   Group opened with brief discussion and psycho-social ed around grief and loss in relationships and in relation to self - identifying life patterns, circumstances, changes that cause losses. Established group norm of speaking from own life experience. Group goal of establishing open and affirming space for members to share loss and experience with grief, normalize grief experience and provide psycho social education and grief support.     

## 2016-04-07 NOTE — Progress Notes (Signed)
[facility] MD Progress Note  04/07/2016 10:34 AM [name] [name]  MRN:  [id number]    Subjective:  Patient reports " I don't feel like I am ready to leave, they are trying to get me into [facility] in about 1 week.  Objective: [name] [name] is awake, alert and oriented X4. Seen sitting in the dayroom interacting with peers.  Denies suicidal or homicidal ideation. Denies auditory or visual hallucination and does not appear to be responding to internal stimuli.  Patient reports he is medication compliant without mediation side effects. Reports " the doctor told me that I can be started on Depakote depending on my lab results.". Patient reports explosive behavior at times. Patient reports he feels like that Zoloft is not helping.  States  depression 5/10. Reports good appetite and reports resting well.- patient report he thinks he has restless legs at night. Support, encouragement and reassurance was provided.   Of Note: AST/ALT 121/123 is trending down. Discussed changing antidepressant to Prozac 10 mg PO QD with titration.  Principal Problem: Substance induced mood disorder ([place]) Diagnosis:   Patient Active Problem List   Diagnosis Date Noted  . Substance induced mood disorder ([place]) [F19.94] 04/01/2016  . Alcohol use disorder ([place]) [F10.99] 04/01/2016  . Alcohol use disorder, severe, dependence ([place]) [F10.20] 09/10/2015  . Alcohol intoxication ([place]) [F10.129]    Total Time spent with patient: 15 minutes    Past Medical History:  Past Medical History  Diagnosis Date  . Cirrhosis of liver not due to alcohol ([place])   . Depression   . Anxiety   . Bipolar affect, depressed ([place])     Past Surgical History  Procedure Laterality Date  . Hernia repair     Family History: History reviewed. No pertinent family history.  Social History:  History  Alcohol Use  . Yes    Comment: case and 1/2 daily of beer (30 to 40 beers daily)     History  Drug Use No    Social History   Social History  .  Marital Status: Single    Spouse Name: N/A  . Number of Children: N/A  . Years of Education: N/A   Social History Main Topics  . Smoking status: Current Every Day Smoker -- 1.50 packs/day  . Smokeless tobacco: None  . Alcohol Use: Yes     Comment: case and 1/2 daily of beer (30 to 40 beers daily)  . Drug Use: No  . Sexual Activity: Not Asked   Other Topics Concern  . None   Social History Narrative   Additional Social History:   Sleep: improved   Appetite:  Good  Current Medications: Current Facility-Administered Medications  Medication Dose Route Frequency Provider Last Rate Last Dose  . acamprosate (CAMPRAL) tablet 666 mg  666 mg Oral TID WC Jenne Campus, MD   666 mg at 04/07/16 0634  . alum & mag hydroxide-simeth (MAALOX/MYLANTA) 200-200-20 MG/5ML suspension 30 mL  30 mL Oral Q4H PRN Niel Hummer, NP   30 mL at 04/06/16 1638  . busPIRone (BUSPAR) tablet 5 mg  5 mg Oral BID Niel Hummer, NP   5 mg at 04/07/16 0845  . [START ON 04/08/2016] FLUoxetine (PROZAC) capsule 10 mg  10 mg Oral Daily Derrill Center, NP      . gabapentin (NEURONTIN) capsule 200 mg  200 mg Oral TID Shuvon B Rankin, NP   200 mg at 04/07/16 0845  . hydrOXYzine (ATARAX/VISTARIL) tablet 50 mg  50 mg Oral Q6H PRN Derrill Center, NP   50 mg at 04/07/16 9977  . ibuprofen (ADVIL,MOTRIN) tablet 400 mg  400 mg Oral Q6H PRN Niel Hummer, NP   400 mg at 04/02/16 4142  . magnesium hydroxide (MILK OF MAGNESIA) suspension 30 mL  30 mL Oral Daily PRN Niel Hummer, NP      . multivitamin with minerals tablet 1 tablet  1 tablet Oral Daily Niel Hummer, NP   1 tablet at 04/07/16 0845  . nicotine (NICODERM CQ - dosed in mg/24 hours) patch 21 mg  21 mg Transdermal Daily Niel Hummer, NP   21 mg at 04/07/16 0845  . thiamine (VITAMIN B-1) tablet 100 mg  100 mg Oral Daily Niel Hummer, NP   100 mg at 04/07/16 0845  . traZODone (DESYREL) tablet 50 mg  50 mg Oral QHS PRN Niel Hummer, NP   50 mg at 04/06/16 2156     Lab Results:  Results for orders placed or performed during the hospital encounter of 04/01/16 (from the past 48 hour(s))  Comprehensive metabolic panel     Status: Abnormal   Collection Time: 04/06/16  6:17 PM  Result Value Ref Range   Sodium 135 135 - 145 mmol/L   Potassium 4.1 3.5 - 5.1 mmol/L   Chloride 106 101 - 111 mmol/L   CO2 23 22 - 32 mmol/L   Glucose, Bld 282 (H) 65 - 99 mg/dL   BUN 11 6 - 20 mg/dL   Creatinine, Ser 0.68 0.61 - 1.24 mg/dL   Calcium 9.0 8.9 - 10.3 mg/dL   Total Protein 7.4 6.5 - 8.1 g/dL   Albumin 4.1 3.5 - 5.0 g/dL   AST 104 (H) 15 - 41 U/L   ALT 119 (H) 17 - 63 U/L   Alkaline Phosphatase 61 38 - 126 U/L   Total Bilirubin 0.7 0.3 - 1.2 mg/dL   GFR calc non Af Amer >60 >60 mL/min   GFR calc Af Amer >60 >60 mL/min    Comment: (NOTE) The eGFR has been calculated using the CKD EPI equation. This calculation has not been validated in all clinical situations. eGFR's persistently <60 mL/min signify possible Chronic Kidney Disease.    Anion gap 6 5 - 15    Comment: Performed at [facility]  Glucose, capillary     Status: None   Collection Time: 04/07/16  6:04 AM  Result Value Ref Range   Glucose-Capillary 99 65 - 99 mg/dL   Comment 1 Notify RN    Comment 2 Document in Chart     Blood Alcohol level:  Lab Results  Component Value Date   ETH 115* 04/01/2016   ETH 241* 04/01/2016    Physical Findings: AIMS: Facial and Oral Movements Muscles of Facial Expression: None, normal Lips and Perioral Area: None, normal Jaw: None, normal Tongue: None, normal,Extremity Movements Upper (arms, wrists, hands, fingers): None, normal Lower (legs, knees, ankles, toes): None, normal, Trunk Movements Neck, shoulders, hips: None, normal, Overall Severity Severity of abnormal movements (highest score from questions above): None, normal Incapacitation due to abnormal movements: None, normal Patient's awareness of abnormal movements (rate only  patient's report): No Awareness, Dental Status Current problems with teeth and/or dentures?: Yes Does patient usually wear dentures?: No  CIWA:  CIWA-Ar Total: 1 COWS:     Musculoskeletal: Strength & Muscle Tone: within normal limits Gait & Station: normal Patient leans: Right  Psychiatric Specialty Exam: Physical  Exam  Nursing note and vitals reviewed. Constitutional: He is oriented to person, place, and time.  Neck: Normal range of motion.  Cardiovascular: Normal rate.   Musculoskeletal: Normal range of motion.  Neurological: He is alert and oriented to person, place, and time.  Psychiatric: He has a normal mood and affect. His behavior is normal.    Review of Systems  Neurological: Negative for tremors.  Psychiatric/Behavioral: Positive for depression and substance abuse. Negative for suicidal ideas and hallucinations. The patient is nervous/anxious and has insomnia.    denies headache, denies chest pain, denies shortness of breath   Blood pressure 131/86, pulse 74, temperature 98 F (36.7 C), temperature source Oral, resp. rate 20, height 5' 10"  (1.778 m), weight 92.534 kg (204 lb), SpO2 98 %.Body mass index is 29.27 kg/(m^2).  General Appearance: Fairly Groomed  Eye Contact:  Good  Speech:  Normal Rate  Volume:  Normal  Mood:  depressed but improving   Affect:  constricted,but smiles at times appropriately   Thought Process:  Linear  Orientation:  Full (Time, Place, and Person)  Thought Content:  denies hallucinations, no delusions, not internally preoccupied   Suicidal Thoughts:  No- denies suicidal or self injurious ideations at this time and contracts for safety on the unit   Homicidal Thoughts:  No- denies homicidal ideations  Memory:  recent and remote grossly intact   Judgement:  Other:  improving   Insight:  improving   Psychomotor Activity:  Normal  Concentration:  Concentration: Good and Attention Span: Good  Recall:  Good  Fund of Knowledge:  Good   Language:  Good  Akathisia:  Negative  Handed:  Right  AIMS (if indicated):     Assets:  Desire for Improvement Resilience  ADL's:  Intact  Cognition:  WNL  Sleep:  Number of Hours: 4     I agree with current treatment plan on 04/07/2016, Patient seen face-to-face for psychiatric evaluation follow-up, chart reviewed. Reviewed the information documented and agree with the treatment plan.  Assessment - Discussed starting Depakote.AST/ALT still elevated.- Ordered repeat of CMP to assess hepatic function prior to starting.  Started on Prozac 10 mg ( with titration) - Labs to be collected: A1C due to symptoms and fast CBGs. Consider DM consult- pending results.  Treatment Plan Summary: Daily contact with patient to assess and evaluate symptoms and progress in treatment, Medication management, Plan inpatient treatment  and medications as below Continue to encourage group and milieu participation to work on coping skills and symptom reduction  Continue to encourage efforts to work on early recovery and relapse prevention efforts  Continue Ativan detox protocol to minimize risk of alcohol WDL Continue  Campral 666 mgrs TID for alcohol cravings  Start Prozac 10 mg PO QHS for mood/explosive behavior Labs to be collected A1c consider DM consults pending results  Discontinue Zoloft 50 mgrs QDAY for depression and anxiety Continue Trazodone 50 mgrs QHS PRN for insomnia  Increase Neurontin to 200 mg Tid for agitation/anxiety/neuropathic pain  Monitor /follow hepatic function tests due to elevated transaminases 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 Ordered CMP-Patient continues to improve  Derrill Center, NP 04/07/2016, 10:34 AM

## 2016-04-07 NOTE — Progress Notes (Signed)
Patient ID: [name] BurrowRobert J Drolet, male   DOB: 05/27/1971, 45 y.o.   MRN: [id number]   DAR: Pt. Denies SI/HI and A/V Hallucinations. He reports sleep is poor, appetite is good, energy level is normal, and concentration is good. He rates depression 6/10, hopelessness 6/10, and anxiety 7/10. Patient does not report any pain or discomfort at this time. Patient reports anxiety to writer this afternoon while he is on the phone. He received PRN Vistaril which provided relief. Support and encouragement provided to the patient. Scheduled medications administered to patient per physician's orders. Patient is cooperative and pleasant with staff. No behavioral issues noted. He is seen in the milieu interacting with peers appropriately. Q15 minute checks are maintained for safety.

## 2016-04-08 MED ORDER — GABAPENTIN 400 MG PO CAPS
400.0000 mg | ORAL_CAPSULE | Freq: Three times a day (TID) | ORAL | Status: DC
  Administered 2016-04-08 – 2016-04-09 (×3): 400 mg via ORAL
  Filled 2016-04-08 (×7): qty 1

## 2016-04-08 MED ORDER — QUETIAPINE FUMARATE 100 MG PO TABS
100.0000 mg | ORAL_TABLET | Freq: Every day | ORAL | Status: DC
  Administered 2016-04-08: 100 mg via ORAL
  Filled 2016-04-08 (×3): qty 1

## 2016-04-08 MED ORDER — ROPINIROLE HCL 1 MG PO TABS
1.0000 mg | ORAL_TABLET | Freq: Every day | ORAL | Status: DC
  Administered 2016-04-08: 1 mg via ORAL
  Filled 2016-04-08 (×3): qty 1

## 2016-04-08 NOTE — Progress Notes (Signed)
[facility]  [name] BruinKristin Kannen Moxey, LCSW Clinical Social Worker [facility] [phone]

## 2016-04-08 NOTE — Tx Team (Addendum)
Interdisciplinary Treatment Plan Update (Adult) Date: 04/08/2016    Time Reviewed: 9:30 AM  Progress in Treatment: Attending groups: Yes Participating in groups: Yes Taking medication as prescribed: Yes Tolerating medication: Yes Family/Significant other contact made: Yes, CSW has spoken with girlfriend Patient understands diagnosis: Yes Discussing patient identified problems/goals with staff: Yes Medical problems stabilized or resolved: Yes Denies suicidal/homicidal ideation: Yes Issues/concerns per patient self-inventory: Yes Other:  New problem(s) identified: N/A  Discharge Plan or Barriers: Patient is interested in residential treatment at discharge. Referral pending at [facility]. Backup plan is [facility].   Reason for Continuation of Hospitalization:  Depression Anxiety Medication Stabilization   Comments: N/A  Estimated length of stay: 1-2 days    Patient is a 45 year old male admitted for substance abuse and SI. Pt reports primary trigger(s) for admission was recent homelessness after being kicked out of home by wife due to his drinking, recent custody loss of daughter, and financial issues. Patient will benefit from crisis stabilization, medication evaluation, group therapy and psycho education in addition to case management for discharge planning. At discharge, it is recommended that Pt remain compliant with established discharge plan and continued treatment.   Review of initial/current patient goals per problem list:  1. Goal(s): Patient will participate in aftercare plan   Met: Yes   Target date: 3-5 days post admission date   As evidenced by: Patient will participate within aftercare plan AEB aftercare provider and housing plan at discharge being identified.  6/15: Goal not met: CSW assessing for appropriate referrals for pt and will have follow up secured prior to d/c.  6/20: Patient is interested in residential treatment at discharge. Referral  pending at [facility]. Backup plan is [facility].   6/21: Goal met. Patient plans to go to [facility] today.       2. Goal (s): Patient will exhibit decreased depressive symptoms and suicidal ideations.   Met: Adequate for discharge   Target date: 3-5 days post admission date   As evidenced by: Patient will utilize self rating of depression at 3 or below and demonstrate decreased signs of depression or be deemed stable for discharge by MD.  6/15: Goal not met: Pt presents with flat affect and depressed mood.  Pt admitted with depression rating of 10.  Pt to show decreased sign of depression and a rating of 3 or less before d/c.  6/19: Patient rates depression 6, denies SI.     6/21: Adequate for discharge per MD. Patient reports improvement in his symptoms, denies SI.       4. Goal(s): Patient will demonstrate decreased signs of withdrawal due to substance abuse   Met: Yes   Target date: 3-5 days post admission date   As evidenced by: Patient will produce a CIWA/COWS score of 0, have stable vitals signs, and no symptoms of withdrawal  6/14: CIWA of 2, experiencing anxiety  6/20: Goal met. No withdrawal symptoms reported at this time per medical chart.     5. Goal(s): Patient will demonstrate decreased signs of psychosis  * Met: Yes  * Target date: 3-5 days post admission date  * As evidenced by: Patient will demonstrate decreased frequency of AVH or return to baseline function   6/15: Goal not met: Pt to take medication as prescribed to decrease psychosis to baseline.   6/20: Goal met. Patient reports improvement of symptoms to baseline.     Attendees:  Patient:    Family:    Physician: Dr.  Eappen, Dr. Einar Grad, MD  04/08/2016   Nursing: Mayra Neer, [place], [place], [place] 04/08/2016   Clinical Social Worker Peri Maris, [place] 04/08/2016   Other: Tilden Fossa, LCSW; Colony Smart LCSW 04/08/2016   Clinical:              Scribe for Treatment Team:  Tilden Fossa, [place]

## 2016-04-08 NOTE — Progress Notes (Signed)
Patient ID: [name] [name], male   DOB: 1971-10-08, 45 y.o.   MRN: [id number] [facility] MD Progress Note  04/08/2016 4:53 PM [name] [name]  MRN:  [id number]    Subjective:  [name] [name] reports "I have a lot of mood swings & restless leg at night. I talked to the pharmacist who suggested that I should have a mood stabilizer to go with the Prozac. She also suggested some medicine for restless leg at night.  Objective: [name] [name] is seen, chart reviewed. He is awake, alert and oriented X 4. He is seen sitting in the dayroom interacting with peers.  Denies suicidal or homicidal ideation. Denies auditory or visual hallucination and does not appear to be responding to internal stimuli.  Patient reports he is medication compliant without mediation side effects. Reports " the doctor told me that I cannot take Depakote due to my liver function". Patient reports explosive behavior at times. Reports good appetite and reports resting well.- patient report he thinks he has restless legs at night. Support, encouragement and reassurance was provided. Will be going to [facility] tomorrow afternoon.  Of Note: AST/ALT 121/123 is trending down. Discussed changing antidepressant to Prozac 10 mg PO QD with titration.  Principal Problem: Substance induced mood disorder ([place]) Diagnosis:   Patient Active Problem List   Diagnosis Date Noted  . Substance induced mood disorder ([place]) [F19.94] 04/01/2016  . Alcohol use disorder ([place]) [F10.99] 04/01/2016  . Alcohol use disorder, severe, dependence ([place]) [F10.20] 09/10/2015  . Alcohol intoxication ([place]) [F10.129]    Total Time spent with patient: 15 minutes  Past Medical History:  Past Medical History  Diagnosis Date  . Cirrhosis of liver not due to alcohol ([place])   . Depression   . Anxiety   . Bipolar affect, depressed ([place])     Past Surgical History  Procedure Laterality Date  . Hernia repair     Family History: History reviewed. No pertinent family history.  Social History:   History  Alcohol Use  . Yes    Comment: case and 1/2 daily of beer (30 to 40 beers daily)     History  Drug Use No    Social History   Social History  . Marital Status: Single    Spouse Name: N/A  . Number of Children: N/A  . Years of Education: N/A   Social History Main Topics  . Smoking status: Current Every Day Smoker -- 1.50 packs/day  . Smokeless tobacco: None  . Alcohol Use: Yes     Comment: case and 1/2 daily of beer (30 to 40 beers daily)  . Drug Use: No  . Sexual Activity: Not Asked   Other Topics Concern  . None   Social History Narrative   Additional Social History:   Sleep: improved   Appetite:  Good  Current Medications: Current Facility-Administered Medications  Medication Dose Route Frequency Provider Last Rate Last Dose  . acamprosate (CAMPRAL) tablet 666 mg  666 mg Oral TID WC [name] Campus, MD   666 mg at 04/08/16 1651  . alum & mag hydroxide-simeth (MAALOX/MYLANTA) 200-200-20 MG/5ML suspension 30 mL  30 mL Oral Q4H PRN Niel Hummer, NP   30 mL at 04/08/16 0224  . busPIRone (BUSPAR) tablet 5 mg  5 mg Oral BID Niel Hummer, NP   5 mg at 04/08/16 1651  . FLUoxetine (PROZAC) capsule 10 mg  10 mg Oral Daily Derrill Center, NP   10 mg at 04/08/16 0747  . gabapentin (  NEURONTIN) capsule 200 mg  200 mg Oral TID Shuvon B Rankin, NP   200 mg at 04/08/16 1651  . hydrOXYzine (ATARAX/VISTARIL) tablet 50 mg  50 mg Oral Q6H PRN Derrill Center, NP   50 mg at 04/07/16 2232  . ibuprofen (ADVIL,MOTRIN) tablet 400 mg  400 mg Oral Q6H PRN Niel Hummer, NP   400 mg at 04/02/16 7939  . magnesium hydroxide (MILK OF MAGNESIA) suspension 30 mL  30 mL Oral Daily PRN Niel Hummer, NP      . multivitamin with minerals tablet 1 tablet  1 tablet Oral Daily Niel Hummer, NP   1 tablet at 04/08/16 0746  . nicotine (NICODERM CQ - dosed in mg/24 hours) patch 21 mg  21 mg Transdermal Daily Niel Hummer, NP   21 mg at 04/08/16 0753  . thiamine (VITAMIN B-1) tablet 100 mg  100  mg Oral Daily Niel Hummer, NP   100 mg at 04/08/16 0746  . traZODone (DESYREL) tablet 50 mg  50 mg Oral QHS PRN Niel Hummer, NP   50 mg at 04/06/16 2156   Lab Results:  Results for orders placed or performed during the hospital encounter of 04/01/16 (from the past 48 hour(s))  Comprehensive metabolic panel     Status: Abnormal   Collection Time: 04/06/16  6:17 PM  Result Value Ref Range   Sodium 135 135 - 145 mmol/L   Potassium 4.1 3.5 - 5.1 mmol/L   Chloride 106 101 - 111 mmol/L   CO2 23 22 - 32 mmol/L   Glucose, Bld 282 (H) 65 - 99 mg/dL   BUN 11 6 - 20 mg/dL   Creatinine, Ser 0.68 0.61 - 1.24 mg/dL   Calcium 9.0 8.9 - 10.3 mg/dL   Total Protein 7.4 6.5 - 8.1 g/dL   Albumin 4.1 3.5 - 5.0 g/dL   AST 104 (H) 15 - 41 U/L   ALT 119 (H) 17 - 63 U/L   Alkaline Phosphatase 61 38 - 126 U/L   Total Bilirubin 0.7 0.3 - 1.2 mg/dL   GFR calc non Af Amer >60 >60 mL/min   GFR calc Af Amer >60 >60 mL/min    Comment: (NOTE) The eGFR has been calculated using the CKD EPI equation. This calculation has not been validated in all clinical situations. eGFR's persistently <60 mL/min signify possible Chronic Kidney Disease.    Anion gap 6 5 - 15    Comment: Performed at [facility]  Glucose, capillary     Status: None   Collection Time: 04/07/16  6:04 AM  Result Value Ref Range   Glucose-Capillary 99 65 - 99 mg/dL   Comment 1 Notify RN    Comment 2 Document in Chart    Blood Alcohol level:  Lab Results  Component Value Date   ETH 115* 04/01/2016   ETH 241* 04/01/2016   Physical Findings: AIMS: Facial and Oral Movements Muscles of Facial Expression: None, normal Lips and Perioral Area: None, normal Jaw: None, normal Tongue: None, normal,Extremity Movements Upper (arms, wrists, hands, fingers): None, normal Lower (legs, knees, ankles, toes): None, normal, Trunk Movements Neck, shoulders, hips: None, normal, Overall Severity Severity of abnormal movements (highest  score from questions above): None, normal Incapacitation due to abnormal movements: None, normal Patient's awareness of abnormal movements (rate only patient's report): No Awareness, Dental Status Current problems with teeth and/or dentures?: Yes Does patient usually wear dentures?: No  CIWA:  CIWA-Ar Total: 0  COWS:     Musculoskeletal: Strength & Muscle Tone: within normal limits Gait & Station: normal Patient leans: Right  Psychiatric Specialty Exam: Physical Exam  Nursing note and vitals reviewed. Constitutional: He is oriented to person, place, and time.  Neck: Normal range of motion.  Cardiovascular: Normal rate.   Musculoskeletal: Normal range of motion.  Neurological: He is alert and oriented to person, place, and time.  Psychiatric: He has a normal mood and affect. His behavior is normal.    Review of Systems  Neurological: Negative for tremors.  Psychiatric/Behavioral: Positive for depression and substance abuse. Negative for suicidal ideas and hallucinations. The patient is nervous/anxious and has insomnia.    denies headache, denies chest pain, denies shortness of breath   Blood pressure 116/79, pulse 97, temperature 97.9 F (36.6 C), temperature source Oral, resp. rate 18, height 5' 10"  (1.778 m), weight 92.534 kg (204 lb), SpO2 98 %.Body mass index is 29.27 kg/(m^2).  General Appearance: Fairly Groomed  Eye Contact:  Good  Speech:  Normal Rate  Volume:  Normal  Mood:  depressed but improving   Affect:  constricted,but smiles at times appropriately   Thought Process:  Linear  Orientation:  Full (Time, Place, and Person)  Thought Content:  denies hallucinations, no delusions, not internally preoccupied   Suicidal Thoughts:  No- denies suicidal or self injurious ideations at this time and contracts for safety on the unit   Homicidal Thoughts:  No- denies homicidal ideations  Memory:  recent and remote grossly intact   Judgement:  Other:  improving   Insight:   improving   Psychomotor Activity:  Normal  Concentration:  Concentration: Good and Attention Span: Good  Recall:  Good  Fund of Knowledge:  Good  Language:  Good  Akathisia:  Negative  Handed:  Right  AIMS (if indicated):     Assets:  Desire for Improvement Resilience  ADL's:  Intact  Cognition:  WNL  Sleep:  Number of Hours: 6     I agree with current treatment plan on 04/07/2016, Patient seen face-to-face for psychiatric evaluation follow-up, chart reviewed. Reviewed the information documented and agree with the treatment plan.  Assessment - Discussed starting Depakote.AST/ALT still elevated.- Ordered repeat of CMP to assess hepatic function prior to starting.  Started on Prozac 10 mg ( with titration) - Labs to be collected: A1C due to symptoms and fast CBGs. Consider DM consult- pending results.  Treatment Plan Summary: Daily contact with patient to assess and evaluate symptoms and progress in treatment, Medication management, Plan inpatient treatment  and medications as below Continue to encourage group and milieu participation to work on coping skills and symptom reduction  Continue to encourage efforts to work on early recovery and relapse prevention efforts  Continue Ativan detox protocol to minimize risk of alcohol WDL Continue  Campral 666 mgrs TID for alcohol cravings  Continue the Prozac 10 mg PO QHS for mood/explosive behavior Labs to be collected A1c consider DM consults pending results  Continue Trazodone 50 mgrs QHS PRN for insomnia. Restless leg syndrome: Will initiate Requip 1 mg Q hs. Mood instability: Initiate Seroquel 100 mg Q hs. Increase the  Neurontin to 400 mg Qid for agitation/anxiety/neuropathic pain  Monitor /follow hepatic function tests due to elevated transaminases  Treatment team working on disposition planning options, patient interested in going to a Rehab ([place]) .  Lindell Spar I, NP, PMHNP-BC. 04/08/2016, 4:53 PM

## 2016-04-08 NOTE — Progress Notes (Signed)
Adult Psychoeducational Group Note  Date:  04/08/2016 Time:  0900   Group Topic/Focus:  Orientation:   The focus of this group is to educate the patient on the purpose and policies of crisis stabilization and provide a format to answer questions about their admission.  The group details unit policies and expectations of patients while admitted.  Participation Level:  Active  Participation Quality:  Appropriate  Affect:  Appropriate  Cognitive:  Appropriate  Insight: Appropriate  Engagement in Group:  Engaged  Modes of Intervention:  Orientation  Additional Comments:  [facility]  Nieshia Larmon L 04/08/2016, 9:44 AM

## 2016-04-08 NOTE — [facility] Suicide Risk Assessment (Signed)
[facility] INPATIENT:  Family/Significant Other Suicide Prevention Education  Suicide Prevention Education:  Education Completed; girlfriend [name] [name] [phone],  (name of family member/significant other) has been identified by the patient as the family member/significant other with whom the patient will be residing, and identified as the person(s) who will aid the patient in the event of a mental health crisis (suicidal ideations/suicide attempt).  With written consent from the patient, the family member/significant other has been provided the following suicide prevention education, prior to the and/or following the discharge of the patient.  The suicide prevention education provided includes the following:  Suicide risk factors  Suicide prevention and interventions  National Suicide Hotline telephone number  [facility] assessment telephone number  [facility] Emergency Assistance 911  [facility] and/or Residential Mobile Crisis Unit telephone number  Request made of family/significant other to:  Remove weapons (e.g., guns, rifles, knives), all items previously/currently identified as safety concern.    Remove drugs/medications (over-the-counter, prescriptions, illicit drugs), all items previously/currently identified as a safety concern.  The family member/significant other verbalizes understanding of the suicide prevention education information provided.  The family member/significant other agrees to remove the items of safety concern listed above.  [name] [name], [name] 04/08/2016, 10:52 AM

## 2016-04-08 NOTE — [facility] Group Notes (Signed)
[facility] LCSW Group Therapy 04/08/2016 1:15 PM Type of Therapy: Group Therapy Participation Level: Active  Participation Quality: Attentive  Affect: Blunted  Cognitive: Alert and Oriented  Insight: Developing/Improving and Engaged  Engagement in Therapy: Developing/Improving and Engaged  Modes of Intervention: Activity, Clarification, Confrontation, Discussion, Education, Exploration, Limit-setting, Orientation, Problem-solving, Rapport Building, Dance movement psychotherapisteality Testing, Socialization and Support  Summary of Progress/Problems: Patient was attentive and engaged with speaker from [employer]. Patient was attentive to speaker while they shared their story of dealing with mental health and overcoming it. Patient expressed interest in their programs and services and received information on their agency. Patient processed ways they can relate to the speaker. Patient came in late to group as he was using the phone.  Samuella BruinKristin Amai Cappiello, LCSW Clinical Social Worker [facility] [phone]

## 2016-04-08 NOTE — Progress Notes (Signed)
Recreation Therapy Notes  Animal-Assisted Activity (AAA) Program Checklist/Progress Notes Patient Eligibility Criteria Checklist & Daily Group note for Rec Tx Intervention  Date: 06.20.2017 Time: 2:45pm [address]    AAA/T Program Assumption of Risk Form signed by Patient/ or Parent Legal Guardian Yes  Patient is free of allergies or sever asthma Yes  Patient reports no fear of animals Yes  Patient reports no history of cruelty to animals Yes  Patient understands his/her participation is voluntary Yes  Behavioral Response: Did not attend.   Marykay Lexenise L Eulamae Greenstein, LRT/CTRS        Silena Wyss L 04/08/2016 2:56 PM

## 2016-04-08 NOTE — Progress Notes (Signed)
[facility] Group Notes:  (Nursing/MHT/Case Management/Adjunct)  Date:  04/08/2016  Time:  2030 Type of Therapy:  wrap up group  Participation Level:  Active  Participation Quality:  Appropriate, Attentive, Sharing and Supportive  Affect:  Appropriate  Cognitive:  Appropriate  Insight:  Improving  Engagement in Group:  Engaged  Modes of Intervention:  Clarification, Education and Support  Summary of Progress/Problems:  Marcille BuffyMcNeil, Addysyn Fern S 04/08/2016, 9:39 PM

## 2016-04-08 NOTE — Progress Notes (Signed)
D: Patient has been visible on the unit.  He is concerned about his medications.  He states he does not want to take prozac.  Patient has been attending some groups on the unit.  He is isolative to his room at times.  He rates his depression and anxiety as a 7; hopelessness as a 3.  His goal today is to "try not to think about what is going on outside of here."  [facility]  He denies SI/HI/AVH.  He is sleeping and eating well.   A: Continue to monitor medication management and MD orders.  Safety checks completed every 15 minutes per protocol.  Offer support and encouragement as needed. R: Patient is receptive to staff; his behavior is appropriate.

## 2016-04-08 NOTE — Progress Notes (Addendum)
D: Complains of anxiety, no other physical complaints.  Appropriate and interacts with staff and peers. Attended group.  Affect blunted, appropriate, mood "anxious".  Denies SI, HI, and AVH.  [facility], says social work will follow up with him tomorrow regarding this.  Patient shared that he has a big support system, a lot of friends in [place].  Shared plans to move away from his current living situation with his wife towards a more supportive environment, and possibly rent out his house. A: PRN vistaril given for anxiety.  Remains on 15 minute checks. R: PRN's effective, observed asleep on 0015 check.  Remains safe on unit.  Addendum- patient woke up 0220 c/o indigestion.  Maalox given, appeared to be asleep shortly after.

## 2016-04-09 ENCOUNTER — Encounter (HOSPITAL_COMMUNITY): Payer: Self-pay | Admitting: Psychiatry

## 2016-04-09 DIAGNOSIS — F1020 Alcohol dependence, uncomplicated: Secondary | ICD-10-CM

## 2016-04-09 DIAGNOSIS — F1420 Cocaine dependence, uncomplicated: Secondary | ICD-10-CM

## 2016-04-09 LAB — HEMOGLOBIN A1C
HEMOGLOBIN A1C: 5.8 % — AB (ref 4.8–5.6)
Mean Plasma Glucose: 120 mg/dL

## 2016-04-09 MED ORDER — NICOTINE 21 MG/24HR TD PT24: 21.0000 mg | MEDICATED_PATCH | Freq: Every day | TRANSDERMAL | Status: AC

## 2016-04-09 MED ORDER — THIAMINE HCL 100 MG PO TABS: 100.0000 mg | ORAL_TABLET | Freq: Every day | ORAL | Status: AC

## 2016-04-09 MED ORDER — ADULT MULTIVITAMIN W/MINERALS CH: 1.0000 | ORAL_TABLET | Freq: Every day | ORAL | Status: AC

## 2016-04-09 MED ORDER — GABAPENTIN 400 MG PO CAPS: 400.0000 mg | ORAL_CAPSULE | Freq: Three times a day (TID) | ORAL | Status: AC

## 2016-04-09 MED ORDER — BUSPIRONE HCL 5 MG PO TABS: 5.0000 mg | ORAL_TABLET | Freq: Two times a day (BID) | ORAL | Status: AC

## 2016-04-09 MED ORDER — HYDROXYZINE HCL 50 MG PO TABS: 50.0000 mg | ORAL_TABLET | Freq: Four times a day (QID) | ORAL | Status: AC | PRN

## 2016-04-09 MED ORDER — FLUOXETINE HCL 10 MG PO CAPS
10.0000 mg | ORAL_CAPSULE | Freq: Every day | ORAL | Status: AC
Start: 2016-04-09 — End: ?

## 2016-04-09 MED ORDER — ROPINIROLE HCL 1 MG PO TABS: 1.0000 mg | ORAL_TABLET | Freq: Every day | ORAL | Status: AC

## 2016-04-09 MED ORDER — QUETIAPINE FUMARATE 100 MG PO TABS: 100.0000 mg | ORAL_TABLET | Freq: Every day | ORAL | Status: AC

## 2016-04-09 MED ORDER — ACAMPROSATE CALCIUM 333 MG PO TBEC: 666.0000 mg | DELAYED_RELEASE_TABLET | Freq: Three times a day (TID) | ORAL | Status: AC

## 2016-04-09 MED ORDER — TRAZODONE HCL 50 MG PO TABS: 50.0000 mg | ORAL_TABLET | Freq: Every evening | ORAL | Status: AC | PRN

## 2016-04-09 NOTE — Progress Notes (Signed)
Pt has been in the dayroom most of the evening watching TV with some interaction with his peers.  He reports he is doing well and [facility]  He says that he is ready, but a little anxious about going tomorrow.  He denies SI/HI/AVH.  He denies any withdrawal symptoms at this time.  He makes his needs known to staff.  He has been pleasant and cooperative with staff.  Support and encouragement offered.  Safety maintained with q15 minute checks.

## 2016-04-09 NOTE — [facility] Group Notes (Addendum)
[facility] LCSW Aftercare Discharge Planning Group Note  04/09/2016  8:45 AM  Participation Quality: Did Not Attend. Patient invited to participate but declined.  Vickki Igou, MSW, LCSW Clinical Social Worker [facility] [phone]   

## 2016-04-09 NOTE — [facility] Suicide Risk Assessment (Signed)
[facility]   Principal Problem: Substance induced mood disorder (HCC) ( cocaine, alcohol) Discharge Diagnoses:  Patient Active Problem List   Diagnosis Date Noted  . Cocaine use disorder, moderate, dependence (HCC) [F14.20] 04/09/2016  . Substance induced mood disorder (HCC) [F19.94] 04/01/2016  . Alcohol use disorder, severe, dependence (HCC) [F10.20] 09/10/2015    Total Time spent with patient: 30 minutes  Musculoskeletal: Strength & Muscle Tone: within normal limits Gait & Station: normal Patient leans: N/A  Psychiatric Specialty Exam: Review of Systems  Psychiatric/Behavioral: Positive for substance abuse. Negative for depression and hallucinations.  All other systems reviewed and are negative.   Blood pressure 101/79, pulse 118, temperature 98.1 F (36.7 C), temperature source Oral, resp. rate 18, height 5\' 10"  (1.778 m), weight 92.534 kg (204 lb), SpO2 98 %.Body mass index is 29.27 kg/(m^2).  General Appearance: Fairly Groomed  Patent attorneyye Contact::  Fair  Speech:  Clear and Coherent409  Volume:  Normal  Mood:  Euthymic  Affect:  Congruent  Thought Process:  Goal Directed and Descriptions of Associations: Intact  Orientation:  Full (Time, Place, and Person)  Thought Content:  Logical  Suicidal Thoughts:  No  Homicidal Thoughts:  No  Memory:  Immediate;   Fair Recent;   Fair Remote;   Fair  Judgement:  Fair  Insight:  Fair  Psychomotor Activity:  Normal  Concentration:  Fair  Recall:  [employer]  Fund of Knowledge:Fair  Language: Fair  Akathisia:  No  Handed:  Right  AIMS (if indicated):     Assets:  Communication Skills Desire for Improvement  Sleep:  Number of Hours: 5.25  Cognition: WNL  ADL's:  Intact   Mental Status Per Nursing Assessment::   On Admission:  Suicidal ideation indicated by patient, Suicide plan, Self-harm thoughts, Self-harm behaviors  Demographic Factors:  Male and Caucasian  Loss Factors: NA  Historical  Factors: Impulsivity  Risk Reduction Factors:   Positive social support  Continued Clinical Symptoms:  Alcohol/Substance Abuse/Dependencies  Cognitive Features That Contribute To Risk:  None    Suicide Risk:  Minimal: No identifiable suicidal ideation.  Patients presenting with no risk factors but with morbid ruminations; may be classified as minimal risk based on the severity of the depressive symptoms  Follow-up Information    Follow up with [facility] On 04/09/2016.   Why:  Go today for continued treatment. Call office if you need to reschedule. Bring 21 day supply of medications.    Contact information:   [address] [place] [phone]      Plan Of Care/Follow-up recommendations:  Activity:  no restrictions Diet:  regular Tests:  as needed Other:  follow up with aftercare  Annina Piotrowski, MD 04/09/2016, 9:40 AM

## 2016-04-09 NOTE — Discharge Summary (Signed)
[facility]ation Unit Discharge Summary Note  Patient:  [name] [name] is an 45 y.o., male MRN:  [id number] DOB:  [birth year] Patient phone:  [phone] (home)  Patient address:   [address] [place] [address],  Total Time spent with patient: 30 minutes  Date of Admission:  04/01/2016  Date of Discharge: 04-09-16  Reason for Admission:  Depression, Alcohol abuse  Principal Problem: Substance induced mood disorder [facility])  Discharge Diagnoses: Patient Active Problem List   Diagnosis Date Noted  . Cocaine use disorder, moderate, dependence (HCC) [F14.20] 04/09/2016  . Substance induced mood disorder (HCC) [F19.94] 04/01/2016  . Alcohol use disorder, severe, dependence (HCC) [F10.20] 09/10/2015    Past Psychiatric History: GAD, MDD, Alcohol abuse  Past Medical History:  Past Medical History  Diagnosis Date  . Cirrhosis of liver not due to alcohol (HCC)   . Depression   . Anxiety   . Bipolar affect, depressed (HCC)     Past Surgical History  Procedure Laterality Date  . Hernia repair     Family History: History reviewed. No pertinent family history. Social History:  History  Alcohol Use  . Yes    Comment: case and 1/2 daily of beer (30 to 40 beers daily)     History  Drug Use No    Social History   Social History  . Marital Status: Single    Spouse Name: N/A  . Number of Children: N/A  . Years of Education: N/A   Social History Main Topics  . Smoking status: Current Every Day Smoker -- 1.50 packs/day  . Smokeless tobacco: None  . Alcohol Use: Yes     Comment: case and 1/2 daily of beer (30 to 40 beers daily)  . Drug Use: No  . Sexual Activity: Not Asked   Other Topics Concern  . None   Social History Narrative   Hospital Course: 45 year old male. Presented to ED, brought in by [employer], requesting help with alcohol dependence and reporting depression and suicidal ideations. Reported drinking close to a case of beer per day. Admission BAL 314,  UDS positive for cocaine. States he has a long history of depression, but states it has been getting worse recently. Reports neuro-vegetative symptoms of depression. His wife recently kicked him out of the home, and he has been homeless - states " I have been out for about 6 days, staying on the streets some days".  Bosten was admitted to the hospital with a BAL of 314 per toxicology tests results & UDS positive for Cocaine metobolites. He admits having been drinking a lot & it has worsened. He was in this hospital for alcohol detox & a referral to a long term substance abuse treatment center for further substance abuse treatment after discharge. Tannen's recent lab reports indicated elevated liver enzymes (AST, ALT), possibly from chronic alcoholism. As a result, not a candidate for Librium detoxification treatment protocols. This is because Librium is a long acting Benzodiazepine used in the detoxification treatment for alcohol & other Benzodiazepine intoxications. This Librium is not suitable for detox treatment in an individual with compromised liver enzymes such as noted above. Klye's detoxification treatment was achieved using Ativan detox regimen on a tapering dose format. Ativan regimen is short acting or has (short half-life). It does not stay long in the system. By using Ativan detox regimen, he received a cleaner detoxification treatment without the lingering adverse effects of the Librium capsules in his system. He was enrolled in  the group counseling sessions, AA/NA meetings being offered and held on this unit. He participated and learned coping skills. He tolerated his treatment regimen without any significant adverse effects and or reactions reported.  Besides the detoxification treatments, Dareon was also medicated & discharged on; Trazodone 50 mg for insomnia, Prozac 20 mg for depression, Hydroxyzine 50 mg prn for anxiety, Buspar 5 mg for anxiety, CAMPRAL 666 mg for alcohol cravings,  Gabapentin 400 mg for agitation/substance withdrawal syndrome, Seroquel 100 mg for mood control & Nicotine patch 21 mg for smoking cessation. He received other medications regimen for the other medical issues presented. He tolerated his treatment regimen without any adverse effects or reactions.   Albertus has completed detox treatment and his mood is stable. This is evidenced by his reports of improved mood & absence of substance withdrawal symptoms. He is encouraged to join/attend AA/NA meetings being offered and held within his community to achieve & maintain maximum sobriety. And for further substance abuse treatment, Cordale will be going to the [facility] treatment Center after discharge.  Upon discharge, Muhammad adamantly denies any suicidal, homicidal ideations, auditory, visual hallucinations, delusional thoughts, paranoia & or substance withdrawal symptoms. He left [facility] with all personal belongings in no apparent distress. He received a 21 days worth supply samples of his [facility] discharge medications provided by [facility] pharmacy. Transportation per [employer].  Physical Findings: AIMS: Facial and Oral Movements Muscles of Facial Expression: None, normal Lips and Perioral Area: None, normal Jaw: None, normal Tongue: None, normal,Extremity Movements Upper (arms, wrists, hands, fingers): None, normal Lower (legs, knees, ankles, toes): None, normal, Trunk Movements Neck, shoulders, hips: None, normal, Overall Severity Severity of abnormal movements (highest score from questions above): None, normal Incapacitation due to abnormal movements: None, normal Patient's awareness of abnormal movements (rate only patient's report): No Awareness, Dental Status Current problems with teeth and/or dentures?: Yes Does patient usually wear dentures?: No  CIWA:  CIWA-Ar Total: 1 COWS:     Musculoskeletal: Strength & Muscle Tone: within normal limits Gait & Station: normal Patient leans: N/A  Psychiatric  Specialty Exam: Review of Systems  Constitutional: Negative.   HENT: Negative.   Eyes: Negative.   Respiratory: Negative.   Cardiovascular: Negative.   Gastrointestinal: Negative.   Genitourinary: Negative.   Musculoskeletal: Negative.   Skin: Negative.   Neurological: Negative.   Endo/Heme/Allergies: Negative.   Psychiatric/Behavioral: Positive for depression (Stable ) and substance abuse. Negative for suicidal ideas, hallucinations and memory loss. The patient has insomnia (Stable). The patient is not nervous/anxious.     Blood pressure 101/79, pulse 118, temperature 98.1 F (36.7 C), temperature source Oral, resp. rate 18, height  (1.778 m), weight 92.534 kg (204 lb), SpO2 98 %.Body mass index is 29.27 kg/(m^2).  See Md's SRA   Have you used any form of tobacco in the last 30 days? (Cigarettes, Smokeless Tobacco, Cigars, and/or Pipes): Yes  Has this patient used any form of tobacco in the last 30 days? (Cigarettes, Smokeless Tobacco, Cigars, and/or Pipes): No  Metabolic Disorder Labs:  Lab Results  Component Value Date   HGBA1C 5.8* 04/08/2016   MPG 120 04/08/2016   No results found for: PROLACTIN No results found for: CHOL, TRIG, HDL, CHOLHDL, VLDL, LDLCALC  Discharge destination:  Home  Is patient on multiple antipsychotic therapies at discharge:  No   Has Patient had three or more failed trials of antipsychotic monotherapy by history:  No  Recommended Plan for Multiple Antipsychotic Therapies: NA  Medication List    STOP taking these medications        citalopram 20 MG tablet  Commonly known as:  CELEXA      TAKE these medications      Indication   acamprosate 333 MG tablet  Commonly known as:  CAMPRAL  Take 2 tablets (666 mg total) by mouth 3 (three) times daily with meals. For alcoholism   Indication:  Excessive Use of Alcohol     busPIRone 5 MG tablet  Commonly known as:  BUSPAR  Take 1 tablet (5 mg total) by mouth 2 (two) times daily. For  anxiety   Indication:  Anxiety Disorder     FLUoxetine 10 MG capsule  Commonly known as:  PROZAC  Take 1 capsule (10 mg total) by mouth daily. For depression   Indication:  Major Depressive Disorder     gabapentin 400 MG capsule  Commonly known as:  NEURONTIN  Take 1 capsule (400 mg total) by mouth 4 (four) times daily -  with meals and at bedtime. For agitation/substance withdrawal syndrome   Indication:  Agitation, Alcohol Withdrawal Syndrome, Restless legs     hydrOXYzine 50 MG tablet  Commonly known as:  ATARAX/VISTARIL  Take 1 tablet (50 mg total) by mouth every 6 (six) hours as needed for anxiety.   Indication:  Anxiety     multivitamin with minerals Tabs tablet  Take 1 tablet by mouth daily. For low vitamin   Indication:  Vitamin Supplementation     nicotine 21 mg/24hr patch  Commonly known as:  NICODERM CQ - dosed in mg/24 hours  Place 1 patch (21 mg total) onto the skin daily. For smoking cessation   Indication:  Nicotine Addiction     QUEtiapine 100 MG tablet  Commonly known as:  SEROQUEL  Take 1 tablet (100 mg total) by mouth at bedtime. For mood control   Indication:  Mood control     rOPINIRole 1 MG tablet  Commonly known as:  REQUIP  Take 1 tablet (1 mg total) by mouth at bedtime. For restless leg syndrome   Indication:  Restless Leg Syndrome     thiamine 100 MG tablet  Take 1 tablet (100 mg total) by mouth daily. For Thiamine replacement   Indication:  Deficiency in Thiamine or Vitamin B1     traZODone 50 MG tablet  Commonly known as:  DESYREL  Take 1 tablet (50 mg total) by mouth at bedtime as needed for sleep.   Indication:  Trouble Sleeping       Follow-up Information    Follow up with [facility] On 04/09/2016.   Why:  Go today for continued treatment. Call office if you need to reschedule. Bring 21 day supply of medications.    Contact information:   [address] [place] [phone]     Follow-up recommendations:  Activity:  As  tolerated Diet: As recommended by your primary care doctor. Keep all scheduled follow-up appointments as recommended.  Comments:   Take all your medications as prescribed by your mental healthcare provider.  Report any adverse effects and or reactions from your medicines to your outpatient provider promptly.  Patient is instructed and cautioned to not engage in alcohol and or illegal drug use while on prescription medicines.  In the event of worsening symptoms, patient is instructed to call the crisis hotline, 911 and or go to the nearest ED for appropriate evaluation and treatment of symptoms.  Follow-up with your primary care provider  for your other medical issues, concerns and or health care needs.   SignedSanjuana Kava, PMHNP-BC 04/09/2016, 4:18 PM

## 2016-04-09 NOTE — Progress Notes (Signed)
Discharge note:  Patient discharged per MD order.  [facility]  He received a 21-day supply of medications.  He received prescriptions.  Reviewed AVS/discharge instructions with patient and he indicated understanding.  Patient denies SI/HI/AVH.  He left in good spirits.

## 2016-04-09 NOTE — Progress Notes (Signed)
  [facility]  Will you be returning to the same living situation after discharge:  No. Patient plans to go to [facility] At discharge, do you have transportation home?: Yes,  [facility] will transport Do you have the ability to pay for your medications: Yes,  patient will be provided with prescriptions at discharge  Release of information consent forms completed and in the chart;  Patient's signature needed at discharge.  Patient to Follow up at: Follow-up Information    Follow up with [facility] On 04/09/2016.   Why:  Go today for continued treatment. Call office if you need to reschedule. Bring 21 day supply of medications.    Contact information:   [address] [place] [phone]      Next level of care provider [facility] Link:no  Safety Planning and Suicide Prevention discussed: Yes,  with patient and girlfriend  Have you used any form of tobacco in the last 30 days? (Cigarettes, Smokeless Tobacco, Cigars, and/or Pipes): Yes  Has patient been referred to the Quitline?: Patient refused referral  Patient has been referred for addiction treatment: Yes  Risha Barretta, [place]bo[place]ristin L 04/09/2016, 8:56 AM

## 2016-11-20 DEATH — deceased
